# Patient Record
Sex: Female | Born: 1985 | Hispanic: No | Marital: Married | Smoking: Never smoker
Health system: Southern US, Community
[De-identification: ages and names within clinical notes are randomized; demographics above are authoritative.]

## PROBLEM LIST (undated history)

## (undated) ENCOUNTER — Inpatient Hospital Stay (HOSPITAL_COMMUNITY): Payer: Self-pay

## (undated) DIAGNOSIS — T4145XA Adverse effect of unspecified anesthetic, initial encounter: Secondary | ICD-10-CM

## (undated) DIAGNOSIS — T8859XA Other complications of anesthesia, initial encounter: Secondary | ICD-10-CM

## (undated) DIAGNOSIS — E039 Hypothyroidism, unspecified: Secondary | ICD-10-CM

## (undated) HISTORY — PX: DILATION AND CURETTAGE OF UTERUS: SHX78

## (undated) HISTORY — DX: Other complications of anesthesia, initial encounter: T88.59XA

## (undated) HISTORY — PX: NO PAST SURGERIES: SHX2092

## (undated) HISTORY — DX: Adverse effect of unspecified anesthetic, initial encounter: T41.45XA

---

## 2017-11-17 LAB — OB RESULTS CONSOLE GC/CHLAMYDIA
CHLAMYDIA, DNA PROBE: NEGATIVE
Chlamydia: NEGATIVE
GC PROBE AMP, GENITAL: NEGATIVE
GC PROBE AMP, GENITAL: NEGATIVE

## 2017-11-17 LAB — OB RESULTS CONSOLE ABO/RH: RH Type: POSITIVE

## 2017-11-17 LAB — OB RESULTS CONSOLE HIV ANTIBODY (ROUTINE TESTING)
HIV: NONREACTIVE
HIV: NONREACTIVE

## 2017-11-17 LAB — OB RESULTS CONSOLE ANTIBODY SCREEN: ANTIBODY SCREEN: NEGATIVE

## 2017-11-17 LAB — OB RESULTS CONSOLE HEPATITIS B SURFACE ANTIGEN
HEP B S AG: NEGATIVE
Hepatitis B Surface Ag: NEGATIVE

## 2017-11-17 LAB — OB RESULTS CONSOLE RUBELLA ANTIBODY, IGM
RUBELLA: IMMUNE
RUBELLA: IMMUNE

## 2017-11-17 LAB — OB RESULTS CONSOLE RPR
RPR: NONREACTIVE
RPR: NONREACTIVE

## 2018-04-10 ENCOUNTER — Inpatient Hospital Stay (HOSPITAL_BASED_OUTPATIENT_CLINIC_OR_DEPARTMENT_OTHER): Payer: Managed Care, Other (non HMO)

## 2018-04-10 ENCOUNTER — Encounter (HOSPITAL_COMMUNITY): Payer: Self-pay | Admitting: *Deleted

## 2018-04-10 ENCOUNTER — Inpatient Hospital Stay (HOSPITAL_COMMUNITY)
Admission: AD | Admit: 2018-04-10 | Discharge: 2018-04-10 | Disposition: A | Discharge status: Home / Self Care | Payer: Managed Care, Other (non HMO) | Source: Ambulatory Visit | Attending: Obstetrics and Gynecology | Admitting: Obstetrics and Gynecology

## 2018-04-10 DIAGNOSIS — Z3A32 32 weeks gestation of pregnancy: Secondary | ICD-10-CM | POA: Insufficient documentation

## 2018-04-10 DIAGNOSIS — O368130 Decreased fetal movements, third trimester, not applicable or unspecified: Secondary | ICD-10-CM

## 2018-04-10 DIAGNOSIS — O368190 Decreased fetal movements, unspecified trimester, not applicable or unspecified: Secondary | ICD-10-CM

## 2018-04-10 HISTORY — DX: Hypothyroidism, unspecified: E03.9

## 2018-04-10 IMAGING — US US MFM FETAL BPP W/O NON-STRESS
1 series · 12 of 28 positions shown · non-contrast
Comparison: none

[Series 1: us mfm fetal bpp w/o non-stress · 28 acquisitions, 12 frames shown]
[im 2/28]
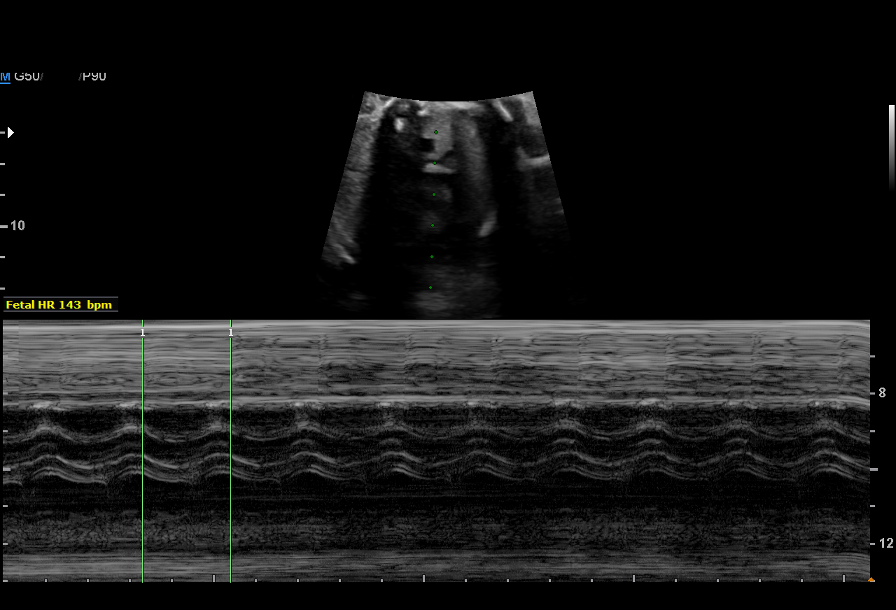
[im 4/28]
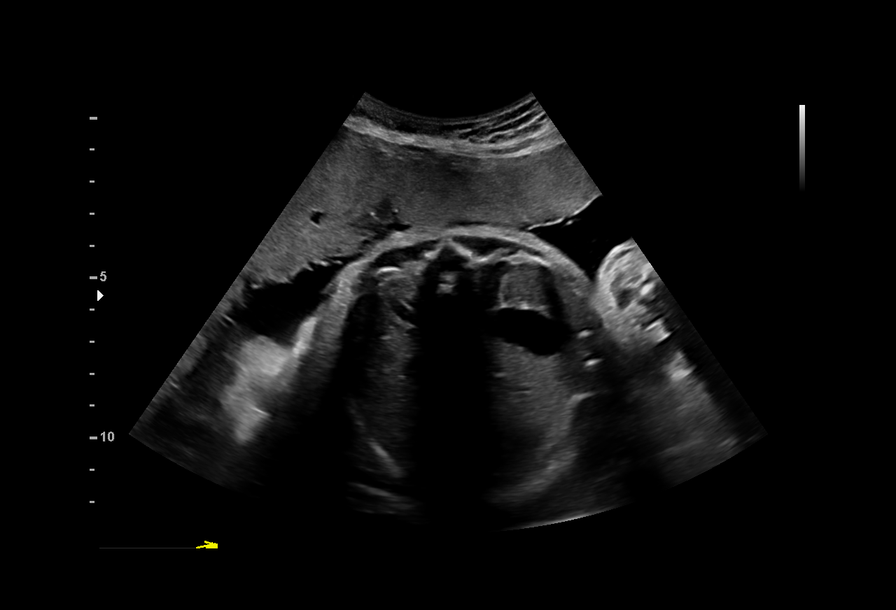
[im 6/28]
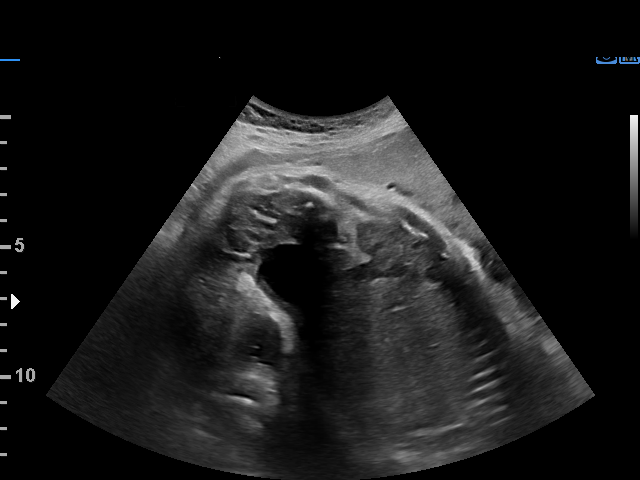
[im 9/28]
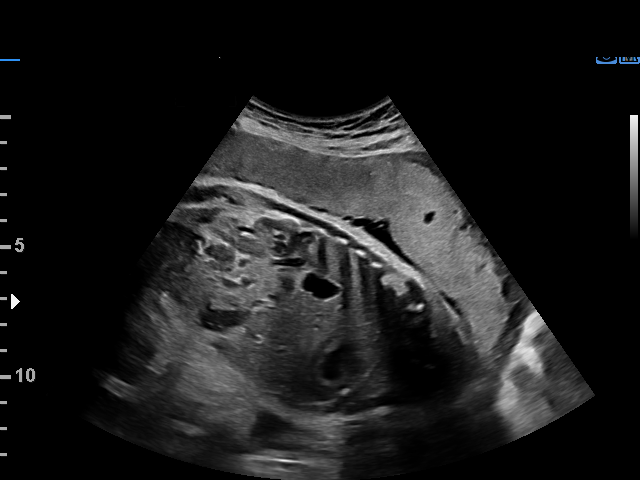
[im 11/28]
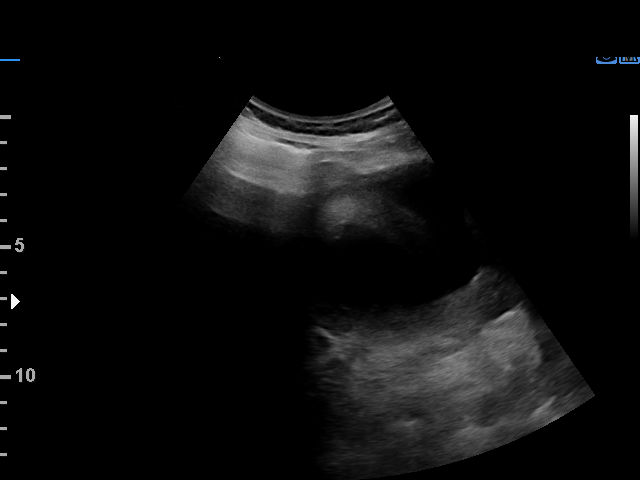
[im 13/28]
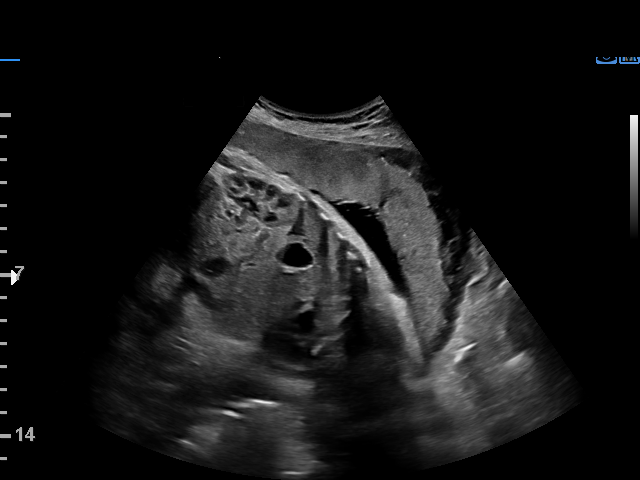
[im 16/28]
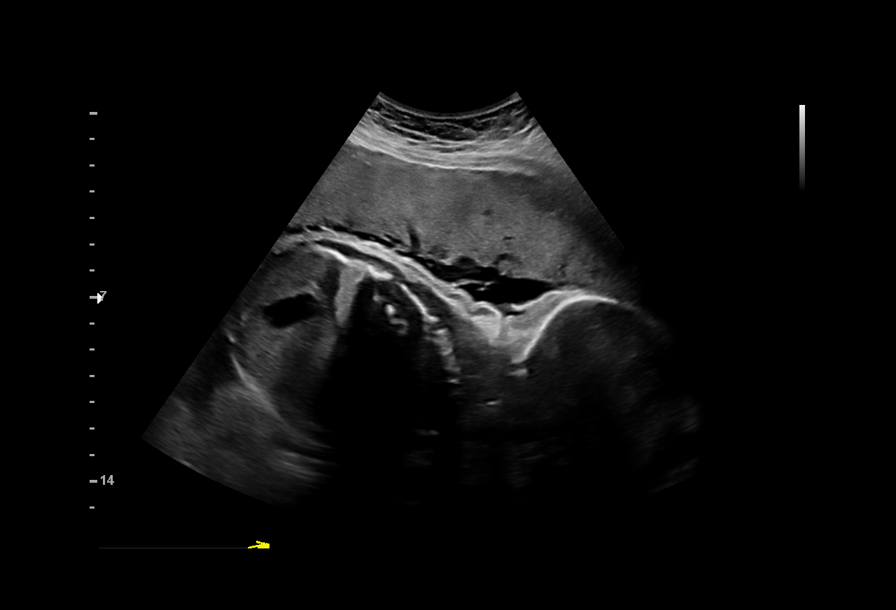
[im 18/28]
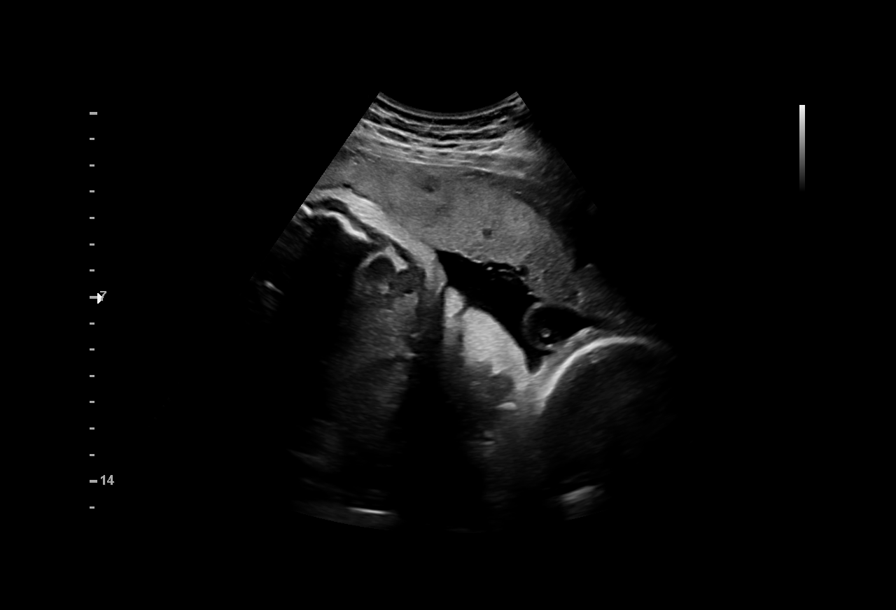
[im 20/28]
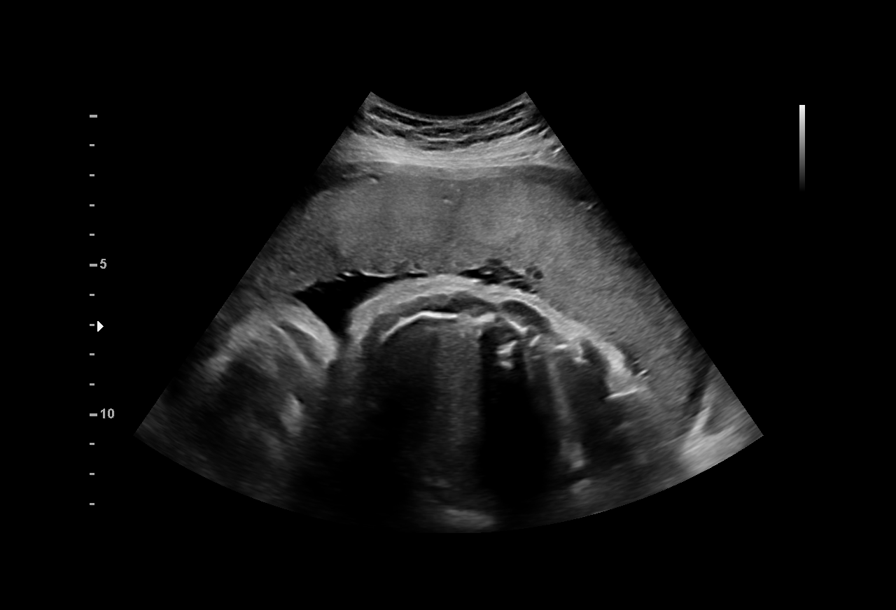
[im 23/28]
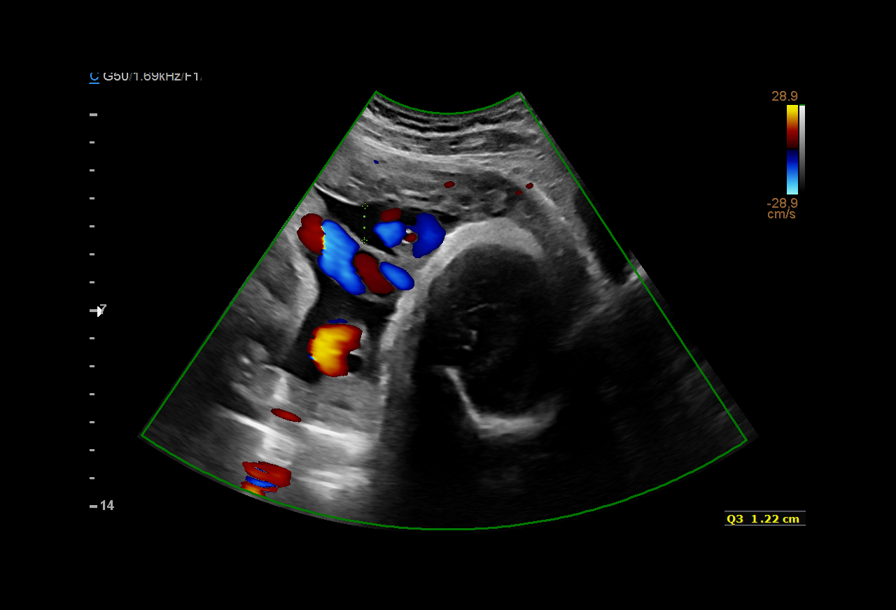
[im 25/28]
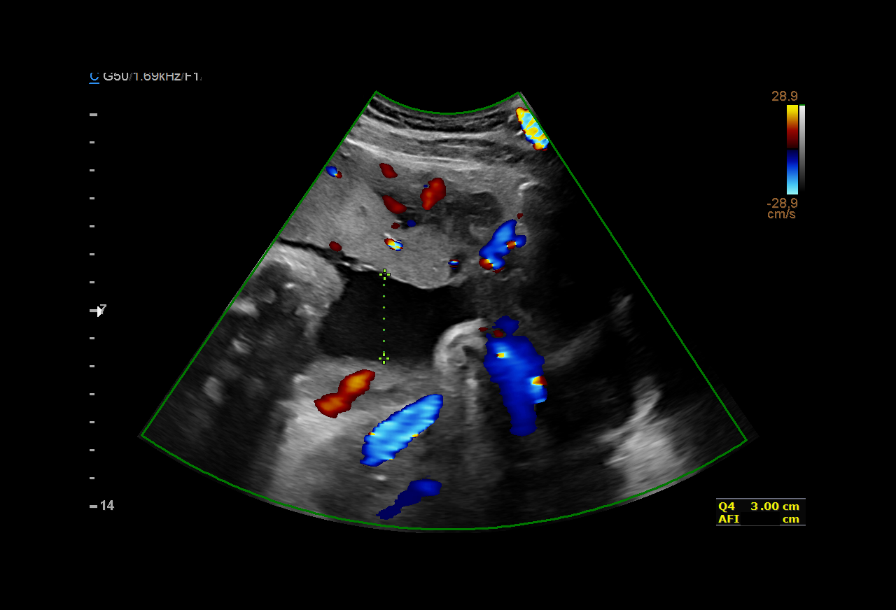
[im 27/28]
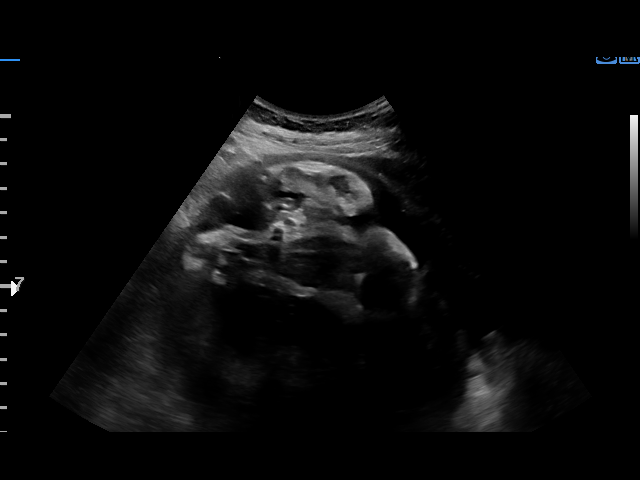

[12 of 28 positions shown; findings below may reference images not displayed]

[facility]

Indications

32 weeks gestation of pregnancy
Decreased fetal movement                       [PS]
Vital Signs

BMI:
Fetal Evaluation

Num Of Fetuses:          1
Fetal Heart Rate(bpm):   143
Cardiac Activity:        Observed
Presentation:            Cephalic
Placenta:                Anterior

Amniotic Fluid
AFI FV:      Within normal limits

AFI Sum(cm)     %Tile       Largest Pocket(cm)
12.42           35

RUQ(cm)       RLQ(cm)       LUQ(cm)        LLQ(cm)
4             3
Biophysical Evaluation

Amniotic F.V:   Within normal limits       F. Tone:         Observed
F. Movement:    Observed                   Score:           [DATE]
F. Breathing:   Observed
OB History

Gravidity:    1
Gestational Age

Clinical EDD:  32w 4d                                        EDD:   [DATE]
Best:          32w 4d     Det. By:  Clinical EDD             EDD:   [DATE]
Cervix Uterus Adnexa

Cervix
Not visualized (advanced GA >[PS])
Impression

IUP at [PS]
Vertex
BPP =[DATE]

## 2018-04-10 NOTE — MAU Note (Addendum)
Have felt baby move only 6 times all day long. Denies any LOF or bleeding. Denies any problems with pregnancy. No pain currently

## 2018-04-10 NOTE — MAU Provider Note (Signed)
History     Chief Complaint  Patient presents with  . Decreased Fetal Movement   32 yo G1P0 Asian female at 52  4/[redacted] weeks gestation presents with c/o decreased fetal movement.  (-) vaginal bleeding, or ctx  OB History    Gravida  1   Para      Term      Preterm      AB      Living        SAB      TAB      Ectopic      Multiple      Live Births              Past Medical History:  Diagnosis Date  . Hypothyroidism     Past Surgical History:  Procedure Laterality Date  . NO PAST SURGERIES      History reviewed. No pertinent family history.  Social History   Tobacco Use  . Smoking status: Never Smoker  . Smokeless tobacco: Never Used  Substance Use Topics  . Alcohol use: Not Currently    Frequency: Never  . Drug use: Never    Allergies: Allergies not on file  No medications prior to admission.     Physical Exam   Blood pressure 111/76, pulse 98, temperature 98.6 F (37 C), resp. rate 18, height 5\' 3"  (1.6 m), weight 81.6 kg.  General appearance: alert, cooperative and no distress Abdomen: gravid soft Extremities: no edema, redness or tenderness in the calves or thighs   Tracing: baseline 150 (+) accel x 1 to 160 No ctx ED Course  IMP: decreased fetal movement IUP@ 32 4/7 wks P) BPP for NR NST MDM  Addendum [place] Mfm Fetal Bpp Wo Non Stress  Result Date: 04/11/2018 ----------------------------------------------------------------------  OBSTETRICS REPORT                        (Signed Final 04/11/2018 08:34 am) ---------------------------------------------------------------------- Patient Info  ID #:       [id number]                          D.O.B.:  05/09/1986 (32 yrs)  Name:       Courtney Peters                   Visit Date: 04/10/2018 08:22 pm ---------------------------------------------------------------------- Performed By  Performed By:     Tomma Lightning             Ref. Address:      [facility]                    RDMS,RVT                                                               [facility]  [address]                                                              [place], [place]                                                              [address]  Attending:        Toma Copier MD            Location:          [facility]  Referred By:      Nena Jordan                    Gianny Sabino MD ---------------------------------------------------------------------- Orders   #  Description                           Code        Ordered By   1  [place] MFM FETAL BPP WO NON               76819.01    Reda Citron      STRESS                                            Egan Berkheimer  ----------------------------------------------------------------------   #  Order #                     Accession #                Episode #   1  [id number]                   [id number]                 [id number]  ---------------------------------------------------------------------- Indications   [redacted] weeks gestation of pregnancy                Z3A.32   Decreased fetal movement                       O36.8190  ---------------------------------------------------------------------- Vital Signs  Weight (lb): 180                               Height:        5'3"  BMI:         31.88 ---------------------------------------------------------------------- Fetal Evaluation  Num Of Fetuses:          1  Fetal Heart Rate(bpm):   143  Cardiac Activity:        Observed  Presentation:            Cephalic  Placenta:                Anterior  Amniotic Fluid  AFI FV:      Within normal limits  AFI Sum(cm)     %  Tile       Largest Pocket(cm)  12.42           35          4.2  RUQ(cm)       RLQ(cm)       LUQ(cm)        LLQ(cm)  4             3             4.2            1.22 ---------------------------------------------------------------------- Biophysical Evaluation  Amniotic F.V:   Within  normal limits       F. Tone:         Observed  F. Movement:    Observed                   Score:           8/8  F. Breathing:   Observed ---------------------------------------------------------------------- OB History  Gravidity:    1 ---------------------------------------------------------------------- Gestational Age  Clinical EDD:  32w 4d                                        EDD:   06/01/18  Best:          32w 4d     Det. By:  Clinical EDD             EDD:   06/01/18 ---------------------------------------------------------------------- Cervix Uterus Adnexa  Cervix  Not visualized (advanced GA >24wks) ---------------------------------------------------------------------- Impression  IUP at [redacted]w[redacted]d  Vertex  BPP =8/8 ----------------------------------------------------------------------                      Toma Copier, MD Electronically Signed Final Report   04/11/2018 08:34 am ----------------------------------------------------------------------  Serita Kyle, MD 7:57 PM 04/10/2018  D/c home. reassuring fetal testing. Resume daily kick ct. Keep sched OB appts

## 2018-04-23 ENCOUNTER — Ambulatory Visit (INDEPENDENT_AMBULATORY_CARE_PROVIDER_SITE_OTHER): Payer: Self-pay | Admitting: Pediatrics

## 2018-04-23 DIAGNOSIS — Z7681 Expectant parent(s) prebirth pediatrician visit: Secondary | ICD-10-CM

## 2018-04-25 ENCOUNTER — Encounter: Payer: Self-pay | Admitting: Pediatrics

## 2018-04-25 DIAGNOSIS — Z7681 Expectant parent(s) prebirth pediatrician visit: Secondary | ICD-10-CM | POA: Insufficient documentation

## 2018-04-25 NOTE — Progress Notes (Signed)
Prenatal counseling for impending newborn done-- Z76.81  

## 2018-04-30 LAB — OB RESULTS CONSOLE GBS: STREP GROUP B AG: NEGATIVE

## 2018-05-28 ENCOUNTER — Encounter (HOSPITAL_COMMUNITY): Payer: Self-pay | Admitting: *Deleted

## 2018-05-28 ENCOUNTER — Telehealth (HOSPITAL_COMMUNITY): Payer: Self-pay | Admitting: *Deleted

## 2018-05-28 NOTE — Telephone Encounter (Signed)
Preadmission screen  

## 2018-06-02 ENCOUNTER — Other Ambulatory Visit: Payer: Self-pay | Admitting: Obstetrics and Gynecology

## 2018-06-05 ENCOUNTER — Other Ambulatory Visit: Payer: Self-pay

## 2018-06-05 ENCOUNTER — Inpatient Hospital Stay (HOSPITAL_COMMUNITY)
Admission: RE | Admit: 2018-06-05 | Discharge: 2018-06-09 | DRG: 787 | Disposition: A | Discharge status: Home / Self Care | Payer: Managed Care, Other (non HMO) | Attending: Obstetrics and Gynecology | Admitting: Obstetrics and Gynecology

## 2018-06-05 ENCOUNTER — Encounter (HOSPITAL_COMMUNITY): Payer: Self-pay

## 2018-06-05 DIAGNOSIS — E039 Hypothyroidism, unspecified: Secondary | ICD-10-CM | POA: Diagnosis present

## 2018-06-05 DIAGNOSIS — O99284 Endocrine, nutritional and metabolic diseases complicating childbirth: Secondary | ICD-10-CM | POA: Diagnosis present

## 2018-06-05 DIAGNOSIS — Z3490 Encounter for supervision of normal pregnancy, unspecified, unspecified trimester: Secondary | ICD-10-CM | POA: Diagnosis present

## 2018-06-05 DIAGNOSIS — D62 Acute posthemorrhagic anemia: Secondary | ICD-10-CM | POA: Diagnosis not present

## 2018-06-05 DIAGNOSIS — O9081 Anemia of the puerperium: Secondary | ICD-10-CM | POA: Diagnosis not present

## 2018-06-05 DIAGNOSIS — Z3A40 40 weeks gestation of pregnancy: Secondary | ICD-10-CM

## 2018-06-05 DIAGNOSIS — O480 Post-term pregnancy: Principal | ICD-10-CM | POA: Diagnosis present

## 2018-06-05 DIAGNOSIS — O9902 Anemia complicating childbirth: Secondary | ICD-10-CM | POA: Diagnosis not present

## 2018-06-05 DIAGNOSIS — Z98891 History of uterine scar from previous surgery: Secondary | ICD-10-CM

## 2018-06-05 LAB — CBC
HCT: 36.5 % (ref 36.0–46.0)
Hemoglobin: 11.3 g/dL — ABNORMAL LOW (ref 12.0–15.0)
MCH: 26.9 pg (ref 26.0–34.0)
MCHC: 31 g/dL (ref 30.0–36.0)
MCV: 86.9 fL (ref 80.0–100.0)
Platelets: 197 10*3/uL (ref 150–400)
RBC: 4.2 MIL/uL (ref 3.87–5.11)
RDW: 15.4 % (ref 11.5–15.5)
WBC: 7.6 10*3/uL (ref 4.0–10.5)
nRBC: 0 % (ref 0.0–0.2)

## 2018-06-05 LAB — TYPE AND SCREEN
ABO/RH(D): A POS
Antibody Screen: NEGATIVE

## 2018-06-05 LAB — ABO/RH: ABO/RH(D): A POS

## 2018-06-05 MED ORDER — LIDOCAINE HCL (PF) 1 % IJ SOLN: 30.0000 mL | INTRAMUSCULAR | Status: DC | PRN

## 2018-06-05 MED ORDER — LACTATED RINGERS IV SOLN: 500.0000 mL | INTRAVENOUS | Status: DC | PRN

## 2018-06-05 MED ORDER — TERBUTALINE SULFATE 1 MG/ML IJ SOLN: 0.2500 mg | Freq: Once | INTRAMUSCULAR | Status: DC | PRN

## 2018-06-05 MED ORDER — OXYTOCIN 40 UNITS IN LACTATED RINGERS INFUSION - SIMPLE MED
1.0000 m[IU]/min | INTRAVENOUS | Status: DC
  Administered 2018-06-05: 2 m[IU]/min via INTRAVENOUS
  Administered 2018-06-06: 6 m[IU]/min via INTRAVENOUS
  Filled 2018-06-05: qty 1000

## 2018-06-05 MED ORDER — OXYTOCIN 40 UNITS IN LACTATED RINGERS INFUSION - SIMPLE MED: 2.5000 [IU]/h | INTRAVENOUS | Status: DC

## 2018-06-05 MED ORDER — ACETAMINOPHEN 325 MG PO TABS: 650.0000 mg | ORAL_TABLET | ORAL | Status: DC | PRN

## 2018-06-05 MED ORDER — LACTATED RINGERS IV SOLN
INTRAVENOUS | Status: DC
  Administered 2018-06-05 (×2): via INTRAVENOUS

## 2018-06-05 MED ORDER — OXYTOCIN BOLUS FROM INFUSION: 500.0000 mL | Freq: Once | INTRAVENOUS | Status: DC

## 2018-06-05 MED ORDER — MISOPROSTOL 50MCG HALF TABLET
50.0000 ug | ORAL_TABLET | ORAL | Status: DC
  Administered 2018-06-05: 50 ug via ORAL
  Filled 2018-06-05 (×2): qty 1

## 2018-06-05 MED ORDER — SOD CITRATE-CITRIC ACID 500-334 MG/5ML PO SOLN
30.0000 mL | ORAL | Status: DC | PRN
  Administered 2018-06-06: 30 mL via ORAL
  Filled 2018-06-05: qty 15

## 2018-06-05 MED ORDER — ONDANSETRON HCL 4 MG/2ML IJ SOLN: 4.0000 mg | Freq: Four times a day (QID) | INTRAMUSCULAR | Status: DC | PRN

## 2018-06-05 NOTE — H&P (Signed)
Courtney SlimmerSurbhi Tora Peters is a 32 y.o. female presenting for IOL 2nd to postdates. PNC complicated by hypothyroidism,. She was followed for .fetal growth which subsequently improved  OB History    Gravida  2   Para      Term      Preterm      AB  1   Living        SAB  1   TAB      Ectopic      Multiple      Live Births             Past Medical History:  Diagnosis Date  . Complication of anesthesia   . Hypothyroidism    Past Surgical History:  Procedure Laterality Date  . DILATION AND CURETTAGE OF UTERUS    . NO PAST SURGERIES     Family History: family history includes Diabetes in her father; Hypertension in her mother; Hypothyroidism in her mother. Social History:  reports that she has never smoked. She has never used smokeless tobacco. She reports previous alcohol use. She reports that she does not use drugs.     Maternal Diabetes: No Genetic Screening: Normal Maternal Ultrasounds/Referrals: Normal Fetal Ultrasounds or other Referrals:  None Maternal Substance Abuse:  No Significant Maternal Medications:  Meds include: Syntroid Significant Maternal Lab Results:  Lab values include: Group B Strep negative Other Comments:  hypothyroidism  Review of Systems  All other systems reviewed and are negative.  Maternal Medical History:  Fetal activity: Perceived fetal activity is normal.    Prenatal complications: hypothyroidism  Prenatal Complications - Diabetes: none.    Dilation: 1 Effacement (%): 50 Station: -3 Exam by:: Courtney SwazilandJordan Johnson, Courtney Peters  Blood pressure 113/88, pulse 96, temperature 98.3 F (36.8 C), temperature source Oral, resp. rate 16, height 5\' 3"  (1.6 m), weight 87.6 kg, last menstrual period 08/25/2017. Maternal Exam:  Uterine Assessment: Contraction strength is mild.  Contraction frequency is irregular and rare.   Abdomen: Patient reports no abdominal tenderness. Estimated fetal weight is 7lb 1/2 lb.   Fetal presentation:  vertex  Introitus: Normal vulva. Ferning test: not done.      Physical Exam  Constitutional: She is oriented to person, place, and time. She appears well-developed and well-nourished.  HENT:  Head: Atraumatic.  Eyes: EOM are normal.  Neck: Neck supple.  Cardiovascular: Regular rhythm.  Respiratory: Effort normal.  GI: Soft.  Genitourinary:    Vulva normal.   Musculoskeletal:        General: Edema present.  Neurological: She is alert and oriented to person, place, and time.  Skin: Skin is warm and dry.  Psychiatric: She has a normal mood and affect.   VE 1/70/-2 soft posterior  Prenatal labs: ABO, Rh: --/--/A POS (12/20 1642) Antibody: NEG (12/20 1642) Rubella: Immune, Immune (06/03 0000) RPR: Nonreactive, Nonreactive (06/03 0000)  HBsAg: Negative, Negative (06/03 0000)  HIV: Non-reactive, Non-reactive (06/03 0000)  GBS: Negative (11/14 0000)   Assessment/Plan: Post dates Hypothyroidism P) admit routine labs. Oral Cytotec. Intracervical balloon placed. Cont synthroid   Courtney Peters 06/05/2018, 5:46 PM

## 2018-06-06 ENCOUNTER — Inpatient Hospital Stay (HOSPITAL_COMMUNITY): Payer: Managed Care, Other (non HMO) | Admitting: Anesthesiology

## 2018-06-06 ENCOUNTER — Encounter (HOSPITAL_COMMUNITY): Payer: Self-pay

## 2018-06-06 ENCOUNTER — Encounter (HOSPITAL_COMMUNITY)
Admission: RE | Disposition: A | Discharge status: Home / Self Care | Payer: Self-pay | Source: Home / Self Care | Attending: Obstetrics and Gynecology

## 2018-06-06 LAB — RPR: RPR: NONREACTIVE

## 2018-06-06 LAB — OB RESULTS CONSOLE RUBELLA ANTIBODY, IGM: Rubella: IMMUNE

## 2018-06-06 SURGERY — Surgical Case
Anesthesia: Epidural

## 2018-06-06 MED ORDER — LACTATED RINGERS IV SOLN: INTRAVENOUS | Status: DC

## 2018-06-06 MED ORDER — KETOROLAC TROMETHAMINE 30 MG/ML IJ SOLN
INTRAMUSCULAR | Status: AC
  Filled 2018-06-06: qty 1

## 2018-06-06 MED ORDER — LIDOCAINE HCL (PF) 1 % IJ SOLN
INTRAMUSCULAR | Status: DC | PRN
  Administered 2018-06-06: 4 mL via EPIDURAL

## 2018-06-06 MED ORDER — SIMETHICONE 80 MG PO CHEW
80.0000 mg | CHEWABLE_TABLET | Freq: Three times a day (TID) | ORAL | Status: DC
  Administered 2018-06-06 – 2018-06-09 (×7): 80 mg via ORAL
  Filled 2018-06-06 (×7): qty 1

## 2018-06-06 MED ORDER — WITCH HAZEL-GLYCERIN EX PADS: 1.0000 "application " | MEDICATED_PAD | CUTANEOUS | Status: DC | PRN

## 2018-06-06 MED ORDER — ZOLPIDEM TARTRATE 5 MG PO TABS: 5.0000 mg | ORAL_TABLET | Freq: Every evening | ORAL | Status: DC | PRN

## 2018-06-06 MED ORDER — DEXAMETHASONE SODIUM PHOSPHATE 10 MG/ML IJ SOLN
INTRAMUSCULAR | Status: DC | PRN
  Administered 2018-06-06: 10 mg via INTRAVENOUS

## 2018-06-06 MED ORDER — EPHEDRINE 5 MG/ML INJ
INTRAVENOUS | Status: AC
  Filled 2018-06-06: qty 10

## 2018-06-06 MED ORDER — MORPHINE SULFATE (PF) 0.5 MG/ML IJ SOLN
INTRAMUSCULAR | Status: DC | PRN
  Administered 2018-06-06: 2 mg via EPIDURAL

## 2018-06-06 MED ORDER — DEXAMETHASONE SODIUM PHOSPHATE 10 MG/ML IJ SOLN
INTRAMUSCULAR | Status: AC
  Filled 2018-06-06: qty 1

## 2018-06-06 MED ORDER — SENNOSIDES-DOCUSATE SODIUM 8.6-50 MG PO TABS
2.0000 | ORAL_TABLET | ORAL | Status: DC
  Administered 2018-06-06 – 2018-06-08 (×3): 2 via ORAL
  Filled 2018-06-06 (×3): qty 2

## 2018-06-06 MED ORDER — DIBUCAINE 1 % RE OINT: 1.0000 "application " | TOPICAL_OINTMENT | RECTAL | Status: DC | PRN

## 2018-06-06 MED ORDER — MORPHINE SULFATE (PF) 0.5 MG/ML IJ SOLN
INTRAMUSCULAR | Status: AC
  Filled 2018-06-06: qty 10

## 2018-06-06 MED ORDER — LACTATED RINGERS IV SOLN: 500.0000 mL | Freq: Once | INTRAVENOUS | Status: DC

## 2018-06-06 MED ORDER — PHENYLEPHRINE 40 MCG/ML (10ML) SYRINGE FOR IV PUSH (FOR BLOOD PRESSURE SUPPORT): 80.0000 ug | PREFILLED_SYRINGE | INTRAVENOUS | Status: DC | PRN

## 2018-06-06 MED ORDER — EPHEDRINE 5 MG/ML INJ: 10.0000 mg | INTRAVENOUS | Status: DC | PRN

## 2018-06-06 MED ORDER — LACTATED RINGERS IV SOLN
INTRAVENOUS | Status: DC | PRN
  Administered 2018-06-06 (×3): via INTRAVENOUS

## 2018-06-06 MED ORDER — MENTHOL 3 MG MT LOZG: 1.0000 | LOZENGE | OROMUCOSAL | Status: DC | PRN

## 2018-06-06 MED ORDER — METHYLERGONOVINE MALEATE 0.2 MG/ML IJ SOLN
INTRAMUSCULAR | Status: AC
  Filled 2018-06-06: qty 1

## 2018-06-06 MED ORDER — SIMETHICONE 80 MG PO CHEW: 80.0000 mg | CHEWABLE_TABLET | ORAL | Status: DC | PRN

## 2018-06-06 MED ORDER — PHENYLEPHRINE 40 MCG/ML (10ML) SYRINGE FOR IV PUSH (FOR BLOOD PRESSURE SUPPORT)
80.0000 ug | PREFILLED_SYRINGE | INTRAVENOUS | Status: AC | PRN
  Administered 2018-06-06 (×2): 40 ug via INTRAVENOUS
  Administered 2018-06-06: 80 ug via INTRAVENOUS

## 2018-06-06 MED ORDER — LACTATED RINGERS AMNIOINFUSION: INTRAVENOUS | Status: DC

## 2018-06-06 MED ORDER — CEFAZOLIN SODIUM-DEXTROSE 2-3 GM-%(50ML) IV SOLR
INTRAVENOUS | Status: DC | PRN
  Administered 2018-06-06: 2 g via INTRAVENOUS

## 2018-06-06 MED ORDER — KETOROLAC TROMETHAMINE 30 MG/ML IJ SOLN
30.0000 mg | Freq: Once | INTRAMUSCULAR | Status: AC | PRN
  Administered 2018-06-06: 30 mg via INTRAVENOUS

## 2018-06-06 MED ORDER — ONDANSETRON HCL 4 MG/2ML IJ SOLN
INTRAMUSCULAR | Status: AC
  Filled 2018-06-06: qty 2

## 2018-06-06 MED ORDER — PHENYLEPHRINE 40 MCG/ML (10ML) SYRINGE FOR IV PUSH (FOR BLOOD PRESSURE SUPPORT)
80.0000 ug | PREFILLED_SYRINGE | INTRAVENOUS | Status: DC | PRN
  Filled 2018-06-06: qty 10

## 2018-06-06 MED ORDER — MIDAZOLAM HCL 2 MG/2ML IJ SOLN: 0.5000 mg | Freq: Once | INTRAMUSCULAR | Status: DC | PRN

## 2018-06-06 MED ORDER — OXYTOCIN 40 UNITS IN LACTATED RINGERS INFUSION - SIMPLE MED: 2.5000 [IU]/h | INTRAVENOUS | Status: DC

## 2018-06-06 MED ORDER — MORPHINE SULFATE (PF) 4 MG/ML IV SOLN: 1.0000 mg | INTRAVENOUS | Status: DC | PRN

## 2018-06-06 MED ORDER — DIPHENHYDRAMINE HCL 50 MG/ML IJ SOLN: 12.5000 mg | INTRAMUSCULAR | Status: DC | PRN

## 2018-06-06 MED ORDER — FENTANYL 2.5 MCG/ML BUPIVACAINE 1/10 % EPIDURAL INFUSION (WH - ANES)
14.0000 mL/h | INTRAMUSCULAR | Status: DC | PRN
  Administered 2018-06-06: 14 mL/h via EPIDURAL
  Filled 2018-06-06: qty 100

## 2018-06-06 MED ORDER — MEPERIDINE HCL 25 MG/ML IJ SOLN: 6.2500 mg | INTRAMUSCULAR | Status: DC | PRN

## 2018-06-06 MED ORDER — METHYLERGONOVINE MALEATE 0.2 MG/ML IJ SOLN
INTRAMUSCULAR | Status: DC | PRN
  Administered 2018-06-06: 0.2 mg via INTRAMUSCULAR

## 2018-06-06 MED ORDER — PROMETHAZINE HCL 25 MG/ML IJ SOLN: 6.2500 mg | INTRAMUSCULAR | Status: DC | PRN

## 2018-06-06 MED ORDER — LEVOTHYROXINE SODIUM 50 MCG PO TABS
50.0000 ug | ORAL_TABLET | Freq: Every day | ORAL | Status: DC
  Administered 2018-06-06 – 2018-06-09 (×4): 50 ug via ORAL
  Filled 2018-06-06 (×5): qty 1

## 2018-06-06 MED ORDER — SIMETHICONE 80 MG PO CHEW
80.0000 mg | CHEWABLE_TABLET | ORAL | Status: DC
  Administered 2018-06-06 – 2018-06-08 (×3): 80 mg via ORAL
  Filled 2018-06-06 (×3): qty 1

## 2018-06-06 MED ORDER — IBUPROFEN 800 MG PO TABS
800.0000 mg | ORAL_TABLET | Freq: Three times a day (TID) | ORAL | Status: AC
  Administered 2018-06-06 – 2018-06-09 (×8): 800 mg via ORAL
  Filled 2018-06-06 (×8): qty 1

## 2018-06-06 MED ORDER — PHENYLEPHRINE 40 MCG/ML (10ML) SYRINGE FOR IV PUSH (FOR BLOOD PRESSURE SUPPORT)
PREFILLED_SYRINGE | INTRAVENOUS | Status: AC
  Filled 2018-06-06: qty 110

## 2018-06-06 MED ORDER — BUPIVACAINE HCL (PF) 0.25 % IJ SOLN
INTRAMUSCULAR | Status: DC | PRN
  Administered 2018-06-06: 20 mL
  Administered 2018-06-06: 10 mL

## 2018-06-06 MED ORDER — LIDOCAINE-EPINEPHRINE 2 %-1:100000 IJ SOLN
INTRAMUSCULAR | Status: DC | PRN
  Administered 2018-06-06: 8 mL via INTRADERMAL

## 2018-06-06 MED ORDER — SCOPOLAMINE 1 MG/3DAYS TD PT72
MEDICATED_PATCH | TRANSDERMAL | Status: AC
  Filled 2018-06-06: qty 1

## 2018-06-06 MED ORDER — PRENATAL MULTIVITAMIN CH
1.0000 | ORAL_TABLET | Freq: Every day | ORAL | Status: DC
  Administered 2018-06-07 – 2018-06-09 (×3): 1 via ORAL
  Filled 2018-06-06 (×3): qty 1

## 2018-06-06 MED ORDER — DIPHENHYDRAMINE HCL 25 MG PO CAPS: 25.0000 mg | ORAL_CAPSULE | Freq: Four times a day (QID) | ORAL | Status: DC | PRN

## 2018-06-06 MED ORDER — SCOPOLAMINE 1 MG/3DAYS TD PT72
MEDICATED_PATCH | TRANSDERMAL | Status: DC | PRN
  Administered 2018-06-06: 1 via TRANSDERMAL

## 2018-06-06 MED ORDER — METOCLOPRAMIDE HCL 5 MG/ML IJ SOLN
INTRAMUSCULAR | Status: DC | PRN
  Administered 2018-06-06: 5 mg via INTRAVENOUS

## 2018-06-06 MED ORDER — OXYTOCIN 10 UNIT/ML IJ SOLN
INTRAMUSCULAR | Status: AC
  Filled 2018-06-06: qty 3

## 2018-06-06 MED ORDER — OXYTOCIN 10 UNIT/ML IJ SOLN
INTRAVENOUS | Status: DC | PRN
  Administered 2018-06-06 (×2): 40 [IU] via INTRAVENOUS

## 2018-06-06 MED ORDER — PHENYLEPHRINE 40 MCG/ML (10ML) SYRINGE FOR IV PUSH (FOR BLOOD PRESSURE SUPPORT)
PREFILLED_SYRINGE | INTRAVENOUS | Status: AC
  Filled 2018-06-06: qty 10

## 2018-06-06 MED ORDER — OXYCODONE HCL 5 MG PO TABS
5.0000 mg | ORAL_TABLET | ORAL | Status: DC | PRN
  Administered 2018-06-09: 5 mg via ORAL
  Filled 2018-06-06 (×2): qty 1

## 2018-06-06 MED ORDER — COCONUT OIL OIL
1.0000 "application " | TOPICAL_OIL | Status: DC | PRN
  Administered 2018-06-08: 1 via TOPICAL
  Filled 2018-06-06: qty 120

## 2018-06-06 MED ORDER — ONDANSETRON HCL 4 MG/2ML IJ SOLN
INTRAMUSCULAR | Status: DC | PRN
  Administered 2018-06-06: 4 mg via INTRAVENOUS

## 2018-06-06 MED ORDER — BUPIVACAINE HCL (PF) 0.25 % IJ SOLN
INTRAMUSCULAR | Status: AC
  Filled 2018-06-06: qty 30

## 2018-06-06 SURGICAL SUPPLY — 43 items
BARRIER ADHS 3X4 INTERCEED (GAUZE/BANDAGES/DRESSINGS) ×3 IMPLANT
BENZOIN TINCTURE PRP APPL 2/3 (GAUZE/BANDAGES/DRESSINGS) ×3 IMPLANT
CHLORAPREP W/TINT 26ML (MISCELLANEOUS) ×3 IMPLANT
CLAMP CORD UMBIL (MISCELLANEOUS) IMPLANT
CLOSURE WOUND 1/2 X4 (GAUZE/BANDAGES/DRESSINGS) ×1
CLOTH BEACON ORANGE TIMEOUT ST (SAFETY) ×3 IMPLANT
DRAPE C SECTION CLR SCREEN (DRAPES) ×3 IMPLANT
DRSG OPSITE POSTOP 4X10 (GAUZE/BANDAGES/DRESSINGS) ×3 IMPLANT
ELECT REM PT RETURN 9FT ADLT (ELECTROSURGICAL) ×3
ELECTRODE REM PT RTRN 9FT ADLT (ELECTROSURGICAL) ×1 IMPLANT
EXTRACTOR VACUUM M CUP 4 TUBE (SUCTIONS) IMPLANT
EXTRACTOR VACUUM M CUP 4' TUBE (SUCTIONS)
GLOVE BIOGEL PI IND STRL 7.0 (GLOVE) ×2 IMPLANT
GLOVE BIOGEL PI INDICATOR 7.0 (GLOVE) ×4
GLOVE ECLIPSE 6.5 STRL STRAW (GLOVE) ×3 IMPLANT
GOWN STRL REUS W/TWL LRG LVL3 (GOWN DISPOSABLE) ×6 IMPLANT
KIT ABG SYR 3ML LUER SLIP (SYRINGE) IMPLANT
NEEDLE HYPO 22GX1.5 SAFETY (NEEDLE) ×3 IMPLANT
NEEDLE HYPO 25X5/8 SAFETYGLIDE (NEEDLE) IMPLANT
NS IRRIG 1000ML POUR BTL (IV SOLUTION) ×3 IMPLANT
PACK C SECTION WH (CUSTOM PROCEDURE TRAY) ×3 IMPLANT
PAD OB MATERNITY 4.3X12.25 (PERSONAL CARE ITEMS) ×3 IMPLANT
RTRCTR C-SECT PINK 25CM LRG (MISCELLANEOUS) IMPLANT
SPONGE LAP 18X18 RF (DISPOSABLE) ×9 IMPLANT
STRIP CLOSURE SKIN 1/2X4 (GAUZE/BANDAGES/DRESSINGS) ×2 IMPLANT
SUT CHROMIC GUT AB #0 18 (SUTURE) IMPLANT
SUT MNCRL 0 VIOLET CTX 36 (SUTURE) ×3 IMPLANT
SUT MON AB 2-0 SH 27 (SUTURE)
SUT MON AB 2-0 SH27 (SUTURE) IMPLANT
SUT MON AB 3-0 SH 27 (SUTURE)
SUT MON AB 3-0 SH27 (SUTURE) IMPLANT
SUT MON AB 4-0 PS1 27 (SUTURE) IMPLANT
SUT MONOCRYL 0 CTX 36 (SUTURE) ×6
SUT PLAIN 2 0 (SUTURE)
SUT PLAIN 2 0 XLH (SUTURE) IMPLANT
SUT PLAIN ABS 2-0 CT1 27XMFL (SUTURE) IMPLANT
SUT VIC AB 0 CT1 36 (SUTURE) ×6 IMPLANT
SUT VIC AB 2-0 CT1 27 (SUTURE) ×2
SUT VIC AB 2-0 CT1 TAPERPNT 27 (SUTURE) ×1 IMPLANT
SUT VIC AB 4-0 PS2 27 (SUTURE) IMPLANT
SYR CONTROL 10ML LL (SYRINGE) ×3 IMPLANT
TOWEL OR 17X24 6PK STRL BLUE (TOWEL DISPOSABLE) ×3 IMPLANT
TRAY FOLEY W/BAG SLVR 14FR LF (SET/KITS/TRAYS/PACK) IMPLANT

## 2018-06-06 NOTE — Brief Op Note (Signed)
06/06/2018  8:54 AM  PATIENT:  Courtney Peters  32 y.o. female  PRE-OPERATIVE DIAGNOSIS:  Fetal Intolerance to Labor, Post Dates  POST-OPERATIVE DIAGNOSIS:  Fetal Intolerance to Labor, Post Dates  PROCEDURE:  Primary Cesarean section, kerr hysterotomy  SURGEON:  Surgeon(s) and Role:    * Maxie Betterousins, Embrie Mikkelsen, MD - Primary  PHYSICIAN ASSISTANT:   ASSISTANTS: Marlinda Mikeanya Bailey, CNM   ANESTHESIA:   epidural Findings: live female OP. Nl tubes and ovaries EBL:  145 mL   BLOOD ADMINISTERED:none  DRAINS: none   LOCAL MEDICATIONS USED:  MARCAINE     SPECIMEN:  Source of Specimen:  placenta not sent  DISPOSITION OF SPECIMEN:  N/A  COUNTS:  YES  TOURNIQUET:  * No tourniquets in log *  DICTATION: .Other Dictation: Dictation Number [id number]  PLAN OF CARE: Admit to inpatient   PATIENT DISPOSITION:  PACU - hemodynamically stable.   Delay start of Pharmacological VTE agent (>24hrs) due to surgical blood loss or risk of bleeding: no

## 2018-06-06 NOTE — Anesthesia Preprocedure Evaluation (Signed)
Anesthesia Evaluation  Patient identified by MRN, date of birth, ID band Patient awake    Reviewed: Allergy & Precautions, Patient's Chart, lab work & pertinent test results  Airway Mallampati: II       Dental no notable dental hx.    Pulmonary    Pulmonary exam normal        Cardiovascular Normal cardiovascular exam     Neuro/Psych    GI/Hepatic   Endo/Other  Hypothyroidism   Renal/GU      Musculoskeletal   Abdominal   Peds  Hematology   Anesthesia Other Findings   Reproductive/Obstetrics (+) Pregnancy                             Anesthesia Physical Anesthesia Plan  ASA: II  Anesthesia Plan: Epidural   Post-op Pain Management:    Induction:   PONV Risk Score and Plan:   Airway Management Planned: Natural Airway  Additional Equipment:   Intra-op Plan:   Post-operative Plan:   Informed Consent: I have reviewed the patients History and Physical, chart, labs and discussed the procedure including the risks, benefits and alternatives for the proposed anesthesia with the patient or authorized representative who has indicated his/her understanding and acceptance.     Plan Discussed with:   Anesthesia Plan Comments: (Lab Results      Component                Value               Date                      WBC                      7.6                 06/05/2018                HGB                      11.3 (L)            06/05/2018                HCT                      36.5                06/05/2018                MCV                      86.9                06/05/2018                PLT                      197                 06/05/2018           )        Anesthesia Quick Evaluation

## 2018-06-06 NOTE — Transfer of Care (Signed)
Immediate Anesthesia Transfer of Care Note  Patient: Courtney Peters  Procedure(s) Performed: CESAREAN SECTION (N/A )  Patient Location: PACU  Anesthesia Type:Epidural  Level of Consciousness: awake, alert  and oriented  Airway & Oxygen Therapy: Patient Spontanous Breathing  Post-op Assessment: Report given to RN and Post -op Vital signs reviewed and stable  Post vital signs: Reviewed and stable  Last Vitals:  Vitals Value Taken Time  BP 128/68 06/06/2018  8:58 AM  Temp 36.4 C 06/06/2018  8:58 AM  Pulse 104 06/06/2018  9:00 AM  Resp 24 06/06/2018  9:00 AM  SpO2 98 % 06/06/2018  9:00 AM  Vitals shown include unvalidated device data.  Last Pain:  Vitals:   06/06/18 0858  TempSrc: Oral         Complications: No apparent anesthesia complications

## 2018-06-06 NOTE — Progress Notes (Signed)
CSW received and acknowledges consult for EDPS of 9.  Consult screened out due to 9 on EDPS does not warrant a CSW consult.  MOB whom scores are greater than 9/yes to question 10 on Edinburgh Postpartum Depression Screen warrants a CSW consult.   Jujhar Everett Boyd-Gilyard, MSW, LCSW Clinical Social Work [phone]  

## 2018-06-06 NOTE — Anesthesia Procedure Notes (Signed)
Epidural Patient location during procedure: OB Start time: 06/06/2018 1:48 AM End time: 06/06/2018 1:54 AM  Staffing Anesthesiologist: Shelton SilvasHollis, Zebastian Carico D, MD Performed: anesthesiologist   Preanesthetic Checklist Completed: patient identified, site marked, surgical consent, pre-op evaluation, timeout performed, IV checked, risks and benefits discussed and monitors and equipment checked  Epidural Patient position: sitting Prep: ChloraPrep Patient monitoring: heart rate, continuous pulse ox and blood pressure Approach: midline Location: L3-L4 Injection technique: LOR saline  Needle:  Needle type: Tuohy  Needle gauge: 17 G Needle length: 9 cm Catheter type: closed end flexible Catheter size: 20 Guage Test dose: negative and 1.5% lidocaine  Assessment Events: blood not aspirated, injection not painful, no injection resistance and no paresthesia  Additional Notes LOR @ 5  Patient identified. Risks/Benefits/Options discussed with patient including but not limited to bleeding, infection, nerve damage, paralysis, failed block, incomplete pain control, headache, blood pressure changes, nausea, vomiting, reactions to medications, itching and postpartum back pain. Confirmed with bedside nurse the patient's most recent platelet count. Confirmed with patient that they are not currently taking any anticoagulation, have any bleeding history or any family history of bleeding disorders. Patient expressed understanding and wished to proceed. All questions were answered. Sterile technique was used throughout the entire procedure. Please see nursing notes for vital signs. Test dose was given through epidural catheter and negative prior to continuing to dose epidural or start infusion. Warning signs of high block given to the patient including shortness of breath, tingling/numbness in hands, complete motor block, or any concerning symptoms with instructions to call for help. Patient was given instructions  on fall risk and not to get out of bed. All questions and concerns addressed with instructions to call with any issues or inadequate analgesia.    Reason for block:procedure for pain

## 2018-06-06 NOTE — Progress Notes (Signed)
Resumption of late decelerations Pitocin discontinued. lates resolved as we prepare for C/S Will therefore proceed with C/S Risk of C/s reviewed with pt and husband  consent signed  fetal intolerance to labor.  cord blood( private) planned OR aware

## 2018-06-06 NOTE — Progress Notes (Signed)
Called by RN @ 5:17 am regarding repetitive late deceleration. On arrival, pitocin 6 miu. Intracervical balloon out Tracing reviewed.  SROM at 2 am  epidural BP 102/80   Pulse 94   Temp 98.3 F (36.8 C) (Oral)   Resp 18   Ht 5\' 3"  (1.6 m)   Wt 87.6 kg   LMP 08/25/2017   BMI 34.21 kg/m  Tracing after epidural had some late deceleration Baseline 150   \VE 5-6/patulous/-2 (+) variable with exam and concomitant ctxs ISE/IUPC placed for poss  Cord compression Pitocin discontinued  IMP: active phase Post dates P) amnioinfusion. Watch FHR closely

## 2018-06-06 NOTE — Progress Notes (Signed)
Amnioinfusion in Tracing: baseline 150 min variability small accels with ctxs Late decel resolved  Ctx q 2-5 mins Restart pitocin Maternal O2 Pt and husband advised if lates recurs then need  C/S

## 2018-06-06 NOTE — Lactation Note (Signed)
This note was copied from a baby's chart. Lactation Consultation Note  Patient Name: Courtney Peters BarSurbhi Florido ZOXWR'UToday's Date: 06/06/2018 Reason for consult: Initial assessment;Primapara;1st time breastfeeding;Term;Maternal endocrine disorder Type of Endocrine Disorder?: Thyroid(hypothyroidism)  4 hours old FT female who is being exclusively BF by her mother, she's a P1. [facility] and she's already familiar with hand expression. When reviewing hand expression with her, noticed that her nipples are short shafted. LC set her up with breast shells, instructions, cleaning and storage were reviewed. Mom able to get colostrum out of both breasts. She has a Lansinoh DEBP at home, that she plans to bring to the hospital to start pumping.  Offered assistance with latch and mom agreed to wake baby up to feed. LC took baby STS to mom's left breast and she was able to latch after a few tries for 6 minutes, she only came off once and was easily repositioned. A few audible swallows were heard, parents were very pleased. Discussed cluster feeding, newborn sleeping cycle and lactogenesis II. Mom had a c-section and she also has Hx of hypothyroidism, LC explained to parents the benefits of pumping early on at the hospital.  Feeding plan:  1. Encouraged mom to feed baby STS 8-12 times/24 hours or sooner if feeding cues are present 2. Hand expression and spoon feeding was also encouraged 3. Mom will bring her Lansinoh DEBP from home to start pumping every 3 hours after feedings, at least 6 pumping sessions/24 hours  BF brochure, BF resources and feeding diary were reviewed. Parents reported all questions and concerns were answered, they're both aware of LC services and will call PRN.  Maternal Data Formula Feeding for Exclusion: No Has patient been taught Hand Expression?: Yes Does the patient have breastfeeding experience prior to this delivery?: No  Feeding Feeding Type: Breast Fed  LATCH  Score Latch: Repeated attempts needed to sustain latch, nipple held in mouth throughout feeding, stimulation needed to elicit sucking reflex.  Audible Swallowing: A few with stimulation  Type of Nipple: Everted at rest and after stimulation(short shafted)  Comfort (Breast/Nipple): Soft / non-tender  Hold (Positioning): Assistance needed to correctly position infant at breast and maintain latch.  LATCH Score: 7  Interventions Interventions: Breast feeding basics reviewed;Assisted with latch;Skin to skin;Breast massage;Hand express;Reverse pressure;Breast compression;Shells;Support pillows;Adjust position  Lactation Tools Discussed/Used Tools: Shells Shell Type: Inverted WIC Program: No   Consult Status Consult Status: Follow-up Date: 06/07/18 Follow-up type: In-patient    Kimya Mccahill Venetia ConstableS Alika Eppes 06/06/2018, 12:11 PM

## 2018-06-06 NOTE — Op Note (Signed)
Courtney Peters, Courtney Peters MEDICAL RECORD [id number] ACCOUNT [id number] DATE OF BIRTH:06/19/1985 FACILITY: [facility] LOCATIO: [id number]: [id number] PHYSICIAN:Cyntia Staley A. Issac Moure, MD  OPERATIVE REPORT  DATE OF PROCEDURE:  06/06/2018  PREOPERATIVE DIAGNOSIS:  Fetal intolerance to labor post date.  PROCEDURE:  Primary cesarean section, Kerr hysterotomy.  POSTOPERATIVE DIAGNOSIS:  Fetal intolerance to labor post date.  ANESTHESIA:  Epidural.  SURGEON:  Maxie BetterSheronette Riham Polyakov, MD  ASSISTANT:  Marlinda Mikeanya Bailey, CNM   DESCRIPTION OF PROCEDURE:  Under adequate epidural anesthesia, the patient was placed in the supine position with a left lateral tilt.  She was sterilely prepped and draped in the usual fashion.  An indwelling Foley catheter was already in place.   Marcaine 0.25% was injected along the planned Pfannenstiel skin incision.  Pfannenstiel skin incision was then made, carried down to the rectus fascia.  The rectus fascia was opened transversely.  The rectus fascia was then bluntly and sharply dissected  off the rectus muscle in a superior and inferior fashion.  The rectus  muscle was split in the midline.  The parietal peritoneum was entered bluntly and extended.  A self-retaining Alexis retractor was then placed.  The vesicouterine peritoneum was  opened transversely.  The bladder was displaced inferiorly with a bladder retractor after blunt dissection was done.  A curvilinear low transverse uterine incision was made and was extended bluntly with cephalic and caudad direction.  Subsequent delivery  of a live female from an occiput posterior position was accomplished.  The baby was bulb suctioned on the abdomen.  The cord was delayed clamped for a minute.  The cord was then clamped, cut.  The baby was transferred to the awaiting pediatricians.   Apgars are pending at this time.  Cord blood banking sample was obtained, as was a small piece of cord for the patient collection.  The placenta was spontaneous, intact,  and not sent to pathology.  Uterine cavity was cleaned of debris.  Uterine incision  had no extensions.  The incision was closed in 2 layers.  The first layer 0 Monocryl running lock stitch, second layer was imbricated using 0 Monocryl suture.  Good hemostasis was achieved.  Normal tubes and ovaries were noted bilaterally.  The abdomen  was irrigated and suctioned of debris.  Interceed was placed in the lower uterine segment in inverted T fashion.  The Alexis retractor was then removed.  The parietal peritoneum was closed with 2-0 Vicryl.  The rectus fascia was closed with 0 Vicryl x2.   The subcutaneous area was irrigated, small bleeders cauterized.  Interrupted 2-0 plain sutures placed, and the skin approximated with 4-0 Vicryl subcuticular closure.  Steri-Strips and benzoin were placed.  SPECIMEN:  Placenta not sent to pathology.  ESTIMATED BLOOD LOSS:  400 mL.  INTRAOPERATIVE FLUIDS:  2300 mL.  URINE OUTPUT:  250 mL clear yellow urine.  COUNTS:  Sponge and instrument counts x2 was correct.  COMPLICATIONS:  None.  The patient tolerated the procedure well and was transferred to recovery room in stable condition.  LN/NUANCE  D:06/06/2018 T:06/06/2018 JOB:[id number]

## 2018-06-06 NOTE — Anesthesia Postprocedure Evaluation (Signed)
Anesthesia Post Note  Patient: Courtney Peters  Procedure(s) Performed: CESAREAN SECTION (N/A )     Patient location during evaluation: PACU Anesthesia Type: Epidural Level of consciousness: awake and alert, patient cooperative and oriented Pain management: pain level controlled Vital Signs Assessment: post-procedure vital signs reviewed and stable Respiratory status: spontaneous breathing, nonlabored ventilation and respiratory function stable Cardiovascular status: blood pressure returned to baseline and stable Postop Assessment: epidural receding and no apparent nausea or vomiting Anesthetic complications: no    Last Vitals:  Vitals:   06/06/18 0945 06/06/18 1005  BP: (!) 108/54 123/75  Pulse: 94 93  Resp: 14 18  Temp:    SpO2: 100% 100%    Last Pain:  Vitals:   06/06/18 0858  TempSrc: Oral   Pain Goal:                 Novelle Addair,E. Rehema Muffley

## 2018-06-07 DIAGNOSIS — Z98891 History of uterine scar from previous surgery: Secondary | ICD-10-CM

## 2018-06-07 LAB — CBC
HCT: 28.8 % — ABNORMAL LOW (ref 36.0–46.0)
Hemoglobin: 9.2 g/dL — ABNORMAL LOW (ref 12.0–15.0)
MCH: 28 pg (ref 26.0–34.0)
MCHC: 31.9 g/dL (ref 30.0–36.0)
MCV: 87.5 fL (ref 80.0–100.0)
Platelets: 165 10*3/uL (ref 150–400)
RBC: 3.29 MIL/uL — ABNORMAL LOW (ref 3.87–5.11)
RDW: 15.9 % — ABNORMAL HIGH (ref 11.5–15.5)
WBC: 10.8 10*3/uL — AB (ref 4.0–10.5)
nRBC: 0 % (ref 0.0–0.2)

## 2018-06-07 LAB — BIRTH TISSUE RECOVERY COLLECTION (PLACENTA DONATION)

## 2018-06-07 MED ORDER — MAGNESIUM OXIDE 400 (241.3 MG) MG PO TABS
400.0000 mg | ORAL_TABLET | Freq: Every day | ORAL | Status: DC
  Administered 2018-06-07 – 2018-06-09 (×3): 400 mg via ORAL
  Filled 2018-06-07 (×4): qty 1

## 2018-06-07 MED ORDER — FERROUS SULFATE 325 (65 FE) MG PO TABS
325.0000 mg | ORAL_TABLET | Freq: Two times a day (BID) | ORAL | Status: DC
  Administered 2018-06-07: 325 mg via ORAL
  Filled 2018-06-07: qty 1

## 2018-06-07 NOTE — Lactation Note (Signed)
This note was copied from a baby's chart. Lactation Consultation Note  Patient Name: Courtney Peters Date: 06/07/2018 Reason for consult: Follow-up assessment;1st time breastfeeding;Primapara;Term;Infant weight loss;Maternal endocrine disorder Type of Endocrine Disorder?: Thyroid, with tx . P1  Baby is 8627 hours old  LC called due to baby having a difficult latch.  As LC entered the room , LC noted a bottle of formula and per dad was given formula due to the  Baby not feeding.  Baby awake, near the breast fully dressed.  LC reviewed basics - and recommended prior to latch - check the diaper if needed,  An feed STS until the baby is gaining back to birth weight, gaining steadily, and can stay awake for  Majority of feeding. LC reviewed the benefits of STS. Firm support. Prior to latch breast massage, hand  Express, pre-pump if needed to prime the milk ducts and latch with firm support.  When latching tickle the baby's upper lip until the baby opens wide/ latch with firm support and breast  Compressions / swallows. Stimulate as needed to keep the baby in a active intermittent feeding pattern.  Discussed nutritive vs non - nutritive feeding patterns and the importance of hanging out latched.  Baby latched with depth / football / with assistance / and showing dad how he could be the breastfeeding helper To latch if  Needed ( dad receptive ). Baby fed for 8 mins with multiple swallows/ increased with breast compressions.  Baby released on her own and was satisfied.  @ consult LC changed a mec stool/ and reviewed with mom and dad how to change a diaper.   LC feels this mom and dad will need review with breast feeding basics several times and assist with latch as needed.  LC [facility] - T. Adela GlimpseBernard of the above plan.    Maternal Data Has patient been taught Hand Expression?: Yes  Feeding Feeding Type: Breast Fed  LATCH Score Latch: Grasps breast easily, tongue  down, lips flanged, rhythmical sucking.  Audible Swallowing: Spontaneous and intermittent  Type of Nipple: Everted at rest and after stimulation  Comfort (Breast/Nipple): Soft / non-tender  Hold (Positioning): Assistance needed to correctly position infant at breast and maintain latch.(worked on positioning and depth )  LATCH Score: 9  Interventions Interventions: Breast feeding basics reviewed;Assisted with latch;Skin to skin;Breast massage;Hand express;Breast compression;Adjust position;Support pillows;Position options;Hand pump;Shells  Lactation Tools Discussed/Used Tools: Shells;Pump Shell Type: Inverted Breast pump type: Manual Pump Review: Setup, frequency, and cleaning Initiated by:: MAI  Date initiated:: 06/07/18   Consult Status Consult Status: Follow-up Date: 06/08/18 Follow-up type: In-patient    Courtney Peters 06/07/2018, 11:13 AM

## 2018-06-07 NOTE — Anesthesia Postprocedure Evaluation (Signed)
Anesthesia Post Note  Patient: Courtney Peters  Procedure(s) Performed: CESAREAN SECTION (N/A )     Patient location during evaluation: Mother Baby Anesthesia Type: Epidural Level of consciousness: awake and alert Pain management: pain level controlled Vital Signs Assessment: post-procedure vital signs reviewed and stable Respiratory status: spontaneous breathing, nonlabored ventilation and respiratory function stable Cardiovascular status: stable Postop Assessment: no headache, no backache, epidural receding, no apparent nausea or vomiting, patient able to bend at knees, able to ambulate and adequate PO intake Anesthetic complications: no    Last Vitals:  Vitals:   06/07/18 0243 06/07/18 0500  BP: 95/65 98/67  Pulse: 93 89  Resp: 18 18  Temp: 36.7 C 36.7 C  SpO2: 95% 95%    Last Pain:  Vitals:   06/07/18 0750  TempSrc:   PainSc: 0-No pain   Pain Goal: Patients Stated Pain Goal: 5 (06/06/18 1400)               Damarion Mendizabal Hristova

## 2018-06-07 NOTE — Progress Notes (Addendum)
POSTOPERATIVE DAY # 1 S/P Primary LTCS for NRFHR, baby girl    S:         Reports feeling tired and sore             Tolerating po intake / no nausea / no vomiting / + flatus / no BM  Denies dizziness, SOB, or CP             Bleeding is light             Pain controlled with Motrin             Up ad lib / ambulatory/ has voided x 1 since urinary catheter has been removed without difficulty  Newborn breast feeding - I assisted with latching while in the room with the football hold; having some difficulty with positioning and latching    O:  VS: BP 98/67   Pulse 89   Temp 98 F (36.7 C) (Oral)   Resp 18   Ht 5\' 3"  (1.6 m)   Wt 87.6 kg   LMP 08/25/2017   SpO2 95%   Breastfeeding Unknown   BMI 34.21 kg/m    LABS:               Recent Labs    06/05/18 1642 06/07/18 0547  WBC 7.6 10.8*  HGB 11.3* 9.2*  PLT 197 165               Bloodtype: --/--/A POS, A POS Performed at [facility], [address] [place], [place] [address]  [phone](12/20 1642)  Rubella: Immune (12/21 0000)                                             I&O: Intake/Output      12/21 0701 - 12/22 0700 12/22 0701 - 12/23 0700   P.O. 1320    I.V. (mL/kg) 2475 (28.3)    Total Intake(mL/kg) 3795 (43.3)    Urine (mL/kg/hr) 3475 (1.7) 900 (1.7)   Blood 247    Total Output 3722 900   Net +73 -900         Flu and Tdap UTD             Physical Exam:             Alert and Oriented X3  Lungs: Clear and unlabored  Heart: regular rate and rhythm / no murmurs  Abdomen: soft, non-tender, non-distended, hypoactive bowel sounds in all quadrants, mild distention              Fundus: firm, non-tender, U-1             Dressing : honeycomb with steri-strips old shadowing noted otherwise dry and intact               Incision:  approximated with sutures / no erythema / no ecchymosis / no drainage; mild edema noted over mons pubis and pannus  Perineum: intact  Lochia: small, no clots  Extremities: +1 BLE edema, no calf  pain or tenderness,   A:        POD # 1 S/P Primary LTCS            Hypothyroidism - on Synthroid  ABL Anemia   P:        Routine postoperative care  Ferrous sulfate 325mg  PO BID WC  Magnesium oxide 400mg  PO daily  See lactation today   May shower today   Encouraged warm liquids and ambulation to promote bowel motility  Continue current care   Carlean JewsMeredith Sigmon, MSN, CNM [facility]

## 2018-06-07 NOTE — Addendum Note (Signed)
Addendum  created 06/07/18 0926 by Elgie CongoMalinova, Reilyn Nelson H, CRNA   Charge Capture section accepted, Clinical Note Signed

## 2018-06-08 MED ORDER — BISACODYL 10 MG RE SUPP: 10.0000 mg | Freq: Every day | RECTAL | Status: DC | PRN

## 2018-06-08 MED ORDER — FERROUS SULFATE 325 (65 FE) MG PO TABS
325.0000 mg | ORAL_TABLET | Freq: Every day | ORAL | Status: DC
  Administered 2018-06-08 – 2018-06-09 (×2): 325 mg via ORAL
  Filled 2018-06-08 (×2): qty 1

## 2018-06-08 NOTE — Lactation Note (Signed)
This note was copied from a baby's chart. Lactation Consultation Note  Patient Name: Courtney Peters's Date: 06/08/2018 Reason for consult: Follow-up assessment;1st time breastfeeding;Primapara;Term;Infant weight loss;Maternal endocrine disorder(8% weight loss ) Type of Endocrine Disorder?: Thyroid(with  tx )  Baby is 4453 hours old As LC entered the room mom resting in bed and baby in the crib asleep.  Per mom the baby recently fed at the breast for 15 mins and supplemented with 15 ml of formula per MD order per mom.  Mom also mentioned she is hand expressing and was able to give the baby 2 ml of EBM prior to feed at the breast.  Per mom mentioned her nipple is sore, with permission assessed and noted she had applied coconut oil and wearing shells.  LC reminded mom to use her EBM 1st and then a dab a coconut oil. No breakdown noted and areola compressible / easily expressed milk.  LC reviewed jaundice and the importance of feeding STS until the baby gains back to birth weight and is gaining steadily , also  Can stay awake for the majority of the feeding.   LC encouraged mom to call when baby is showing feeding cues .   LC Plan :  Breast shells between feedings except when sleeping  Feed with feeding cues and by 3 hours due to weight loss - STS  Feed 15 -20 mins , ( 30 mins max ) and supplement  With EBM or formula  Post pump both breast for 15 -20 mins after feeding.    Maternal Data Has patient been taught Hand Expression?: Yes  Feeding Feeding Type: (pe rmom baby last fed at 1245 )  LATCH Score  ( Latch Score by the [facility] - was 6 the previous LC was 8 at 0910 this am  Latch: Repeated attempts needed to sustain latch, nipple held in mouth throughout feeding, stimulation needed to elicit sucking reflex.  Audible Swallowing: A few with stimulation  Type of Nipple: Everted at rest and after stimulation  Comfort (Breast/Nipple): Filling, red/small blisters or bruises,  mild/mod discomfort  Hold (Positioning): Assistance needed to correctly position infant at breast and maintain latch.  LATCH Score: 6  Interventions Interventions: Breast feeding basics reviewed  Lactation Tools Discussed/Used Tools: Shells;Pump Shell Type: Inverted Breast pump type: Double-Electric Breast Pump;Manual   Consult Status Consult Status: Follow-up Date: 06/08/18 Follow-up type: In-patient    Courtney Peters 06/08/2018, 1:16 PM

## 2018-06-08 NOTE — Progress Notes (Signed)
POSTOPERATIVE DAY # 2 S/P CS   S:         Reports feeling really sore - more pain today              Tolerating po intake / no nausea / no vomiting / little only flatus / no BM but feels like needs to go             Bleeding is light             Pain controlled with motrin and oxycodone             Up ad lib / ambulatory/ voiding QS  Newborn Bottle, Breast and formula supplementation  O:  VS: BP 109/75   Pulse 80   Temp 98.4 F (36.9 C) (Oral)   Resp 18   Ht 5\' 3"  (1.6 m)   Wt 87.6 kg   LMP 08/25/2017   SpO2 100%   Breastfeeding Unknown   BMI 34.21 kg/m    LABS:              Recent Labs    06/05/18 1642 06/07/18 0547  WBC 7.6 10.8*  HGB 11.3* 9.2*  PLT 197 165               Bloodtype: --/--/A POS, A POS Performed at [facility], [address] [place], [place] [address]  [phone](12/20 1642)  Rubella: Immune (12/21 0000)                                            I&O: Intake/Output      12/22 0701 - 12/23 0700 12/23 0701 - 12/24 0700   P.O.     I.V. (mL/kg)     Total Intake(mL/kg)     Urine (mL/kg/hr) 900 (0.4)    Blood     Total Output 900    Net -900                    Physical Exam:             Alert and Oriented X3  Lungs: Clear and unlabored  Heart: regular rate and rhythm / no mumurs  Abdomen: soft, non-tender, mildly distended, hypoactive BS             Fundus: firm, non-tender, Ueven             Dressing intact honeycomb              Incision:  approximated with suture / no erythema / no ecchymosis / no drainage  Perineum: intact  Lochia: light  Extremities: 1+ edema, no calf pain or tenderness  A:        POD # 2 S/P CS - inadequate pain control with increased gas pain            ABL anemia  P:        Routine postoperative care              Promote bowel motility / adjust Motrin dose / advance post-op activity             Anticipate DC tomorrow   Marlinda Mikeanya Reggie Welge CNM, MSN, [facility] 06/08/2018, 8:39 AM

## 2018-06-09 DIAGNOSIS — O9902 Anemia complicating childbirth: Secondary | ICD-10-CM | POA: Diagnosis not present

## 2018-06-09 DIAGNOSIS — E039 Hypothyroidism, unspecified: Secondary | ICD-10-CM | POA: Diagnosis present

## 2018-06-09 MED ORDER — FERROUS SULFATE 325 (65 FE) MG PO TABS: 325.0000 mg | ORAL_TABLET | Freq: Every day | ORAL | 3 refills | Status: DC

## 2018-06-09 MED ORDER — COCONUT OIL OIL: 1.0000 "application " | TOPICAL_OIL | 0 refills | Status: DC | PRN

## 2018-06-09 MED ORDER — SIMETHICONE 80 MG PO CHEW: 80.0000 mg | CHEWABLE_TABLET | ORAL | 0 refills | Status: DC | PRN

## 2018-06-09 MED ORDER — OXYCODONE HCL 5 MG PO TABS: 5.0000 mg | ORAL_TABLET | ORAL | 0 refills | Status: DC | PRN

## 2018-06-09 MED ORDER — SENNOSIDES-DOCUSATE SODIUM 8.6-50 MG PO TABS: 2.0000 | ORAL_TABLET | ORAL | Status: DC

## 2018-06-09 NOTE — Progress Notes (Addendum)
Nauseous,unable to stand or progress diet at this time

## 2018-06-09 NOTE — Discharge Summary (Addendum)
OB Discharge Summary  Patient Name: Courtney Peters DOB: [birth year] MRN: [id number]  Date of admission: 06/05/2018 Delivering provider: Greta Yung   Date of discharge: 06/09/2018  Admitting diagnosis: postdates Intrauterine pregnancy: 7039w5d     Secondary diagnosis:Principal Problem:   Postpartum care following cesarean delivery (12/21) Active Problems:   S/P cesarean section   Maternal anemia, with delivery   Hypothyroidism  Additional problems:none     Discharge diagnosis:  Patient Active Problem List   Diagnosis Date Noted  . Maternal anemia, with delivery 06/09/2018  . Hypothyroidism 06/09/2018  . S/P cesarean section 06/07/2018  . Postpartum care following cesarean delivery (12/21) 06/06/2018  . Counseling of expectant parents at pediatric pre-birth visit 04/25/2018                                                                Post partum procedures:none  Augmentation: Pitocin, Cytotec and Foley Balloon Pain control: Epidural  Laceration:None  Episiotomy:None  Complications: None  Hospital course:  Induction of Labor With Cesarean Section  32 y.o. yo G2P1011 at 6939w5d was admitted to the hospital 06/05/2018 for induction of labor. Patient had a labor course significant for cervical balloon, misoprostol orally, and Pitocin augmentation. The patient went for cesarean section due to fetal intolerance to labor and delivered a Viable infant,06/06/2018  Membrane Rupture Time/Date: 1:10 AM ,06/06/2018   Details of operation can be found in separate operative Note.  Patient had an uncomplicated postpartum course. She is ambulating, tolerating a regular diet, passing flatus, and urinating well.  Patient is discharged home in stable condition on 06/09/18.                                    Physical exam  Vitals:   06/07/18 2147 06/08/18 0618 06/08/18 2129 06/09/18 0550  BP: 133/86 109/75 135/85 130/79  Pulse: 80 80 85 77  Resp:  18 16 18   Temp: 98.7 F (37.1 C)  98.4 F (36.9 C) 98.6 F (37 C) 97.7 F (36.5 C)  TempSrc: Oral Oral Oral Oral  SpO2: 100%  98% 100%  Weight:      Height:       General: alert, cooperative and no distress Lochia: appropriate Uterine Fundus: firm Incision: Healing well with no significant drainage DVT Evaluation: No cords or calf tenderness. No significant calf/ankle edema. Labs: Lab Results  Component Value Date   WBC 10.8 (H) 06/07/2018   HGB 9.2 (L) 06/07/2018   HCT 28.8 (L) 06/07/2018   MCV 87.5 06/07/2018   PLT 165 06/07/2018   No flowsheet data found.  Vaccines: TDaP UTD         Flu    UTD  Discharge instruction: per After Visit Summary and "Baby and Me Booklet".  After Visit Meds:  Allergies as of 06/09/2018   No Known Allergies     Medication List    TAKE these medications   acetaminophen 325 MG tablet Commonly known as:  TYLENOL Take 650 mg by mouth every 6 (six) hours as needed.   cholecalciferol 25 MCG (1000 UT) tablet Commonly known as:  VITAMIN D3 Take 1,000 Units by mouth at bedtime.   coconut oil Oil Apply 1 application topically as  needed.   ferrous sulfate 325 (65 FE) MG tablet Take 1 tablet (325 mg total) by mouth daily with breakfast. Start taking on:  June 10, 2018   levothyroxine 100 MCG tablet Commonly known as:  SYNTHROID, LEVOTHROID Take 100 mcg by mouth daily before breakfast.   magnesium oxide 400 MG tablet Commonly known as:  MAG-OX Take 400 mg by mouth at bedtime.   oxyCODONE 5 MG immediate release tablet Commonly known as:  Oxy IR/ROXICODONE Take 1-2 tablets (5-10 mg total) by mouth every 4 (four) hours as needed for moderate pain.   prenatal multivitamin Tabs tablet Take 1 tablet by mouth at bedtime.   senna-docusate 8.6-50 MG tablet Commonly known as:  Senokot-S Take 2 tablets by mouth daily. Start taking on:  June 10, 2018   simethicone 80 MG chewable tablet Commonly known as:  MYLICON Chew 1 tablet (80 mg total) by mouth as needed  for flatulence.       Diet: routine diet  Activity: Advance as tolerated. Pelvic rest for 6 weeks.   Postpartum contraception: Not Discussed  Newborn Data: Live born female  Birth Weight: 8 lb 8.3 oz (3865 g) APGAR: 6, 8  Newborn Delivery   Birth date/time:  06/06/2018 08:09:00 Delivery type:  C-Section, Low Transverse C-section categorization:  Primary     named Rose Baby Feeding: Bottle and Breast Disposition:remains inpatient for phototherapy, mother rooming in   Delivery Report:  Review the Delivery Report for details.    Follow up: Follow-up Information    Maxie Betterousins, Tyronda Vizcarrondo, MD. Schedule an appointment as soon as possible for a visit in 6 week(s).   Specialty:  Obstetrics and Gynecology Contact information: [address] [place] [address] [phone]             Signed: Cipriano Mileaniela C Paul, CNM, MSN 06/09/2018, 10:33 AM

## 2018-06-09 NOTE — Progress Notes (Addendum)
Nauseous, unable to stand or progress diet.

## 2018-06-09 NOTE — Progress Notes (Signed)
Patient ID: Courtney Peters, female   DOB: 1985/07/07, 32 y.o.   MRN: [id number] Subjective: POD# 3 Live born female  Birth Weight: 8 lb 8.3 oz (3865 g) APGAR: 6, 8  Newborn Delivery   Birth date/time:  06/06/2018 08:09:00 Delivery type:  C-Section, Low Transverse C-section categorization:  Primary    Baby name: Rose Delivering provider: COUSINS, SHERONETTE   Feeding: breast, bottle  Pain control at delivery: Epidural   Reports feeling well.  Patient reports tolerating PO.   Breast symptoms: + colostrum Pain controlled with PO meds Denies HA/SOB/C/P/N/V/dizziness. Flatus present. She reports vaginal bleeding as normal, without clots.  She is ambulating, urinating without difficulty.     Objective:   VS:    Vitals:   06/07/18 2147 06/08/18 0618 06/08/18 2129 06/09/18 0550  BP: 133/86 109/75 135/85 130/79  Pulse: 80 80 85 77  Resp:  18 16 18   Temp: 98.7 F (37.1 C) 98.4 F (36.9 C) 98.6 F (37 C) 97.7 F (36.5 C)  TempSrc: Oral Oral Oral Oral  SpO2: 100%  98% 100%  Weight:      Height:        No intake or output data in the 24 hours ending 06/09/18 0911      Recent Labs    06/07/18 0547  WBC 10.8*  HGB 9.2*  HCT 28.8*  PLT 165     Blood type: --/--/A POS, A POS Performed at [facility], [address] [place], [place] [address]  (12/20 1642)  Rubella: Immune (12/21 0000)  Vaccines: TDaP UTD         Flu    UTD   Physical Exam:  General: alert, cooperative and no distress Abdomen: soft, nontender, normal bowel sounds Incision: dry, intact and minimal old drainage Uterine Fundus: firm, below umbilicus, nontender Lochia: minimal Ext: Homans sign is negative, no sign of DVT and no edema, redness or tenderness in the calves or thighs      Assessment/Plan: 32 y.o.   POD# 3. [id number]                  Principal Problem:   Postpartum care following cesarean delivery (12/21)  - indication: fetal intolerance to labor Active Problems:   S/P  cesarean section   Maternal anemia, with delivery  - oral Fe and Mag Ox  Doing well, stable.    Routine post-op care             DC home today w/ instructions  [facility] and PRN   Neta Mendsaniela C Dezaree Tracey, CNM, MSN 06/09/2018, 9:11 AM

## 2018-06-09 NOTE — Progress Notes (Addendum)
Nauseous, did not stand up patient to ambulate

## 2018-06-10 ENCOUNTER — Ambulatory Visit: Payer: Self-pay

## 2018-06-10 NOTE — Lactation Note (Signed)
This note was copied from a baby's chart. Lactation Consultation Note  Patient Name: Girl Sanita Estrada ETUYW'S Date: 06/10/2018  Infant swaddled in crib. Baby girl Kalman Shan now 67 days old. Parents report mom did not attempt to breastfed her this last feeding time that they just fed her EMM and formula.  Mom reports still struggling with breastfeeding. Parents report she ate at approximately two hours ago.  Asked if we could try breastfeeding.  Parents in agreement.  Assist with cradle hold on left breast.  Infant latches and breastfeed well. Mom reports comfort. After about 5 minutes infant let go satiated, arms to side. Milk running out of side of mouth slightly.  Mom has sprays of colostrum/transitional milk. Urged mom to always try and breastfeed first.  Discussed prepumping to help elongate nipple and get milk there.  Mom already has manual pump for this and showed how to use conversion kit as manual pump also. Parents report having DEBP Lansinoh at home for use.Then pump/hand express and feed back all EMM .  Left mom and baby STS.  Urged mom to follow up with outpatient lactation.   Maternal Data    Feeding    LATCH Score                   Interventions    Lactation Tools Discussed/Used     Consult Status      Izzabell Klasen Thompson Caul 06/10/2018, 10:05 AM

## 2018-06-10 NOTE — Lactation Note (Signed)
This note was copied from a baby's chart. Lactation Consultation Note  Patient Name: Courtney Peters: 06/10/2018 Reason for consult: Follow-up assessment   Baby 704 days old. Off phototherapy. Reviewed waking techniques. Mother latched baby in both football and cross cradle hold. Helped w/ breast support and compression. Sucks and swallows observed. FOB will give breastmilk w/ slow flow nipple after breastfeeding. Discussed paced feeding. Reviewed engorgement care and monitoring voids/stools.    Maternal Data Has patient been taught Hand Expression?: Yes  Feeding Feeding Type: Breast Fed  LATCH Score Latch: Grasps breast easily, tongue down, lips flanged, rhythmical sucking.  Audible Swallowing: A few with stimulation  Type of Nipple: Everted at rest and after stimulation  Comfort (Breast/Nipple): Filling, red/small blisters or bruises, mild/mod discomfort  Hold (Positioning): Assistance needed to correctly position infant at breast and maintain latch.  LATCH Score: 7  Interventions Interventions: Breast feeding basics reviewed;Assisted with latch;Skin to skin;Pre-pump if needed;Support pillows;Position options;Expressed milk;DEBP  Lactation Tools Discussed/Used     Consult Status Consult Status: Complete Peters: 06/10/18    Dahlia ByesBerkelhammer, Molly Maselli [facility] 06/10/2018, 2:36 PM

## 2018-06-10 NOTE — Lactation Note (Signed)
This note was copied from a baby's chart. Lactation Consultation Note  Patient Name: Courtney Peters Reason for consult: Follow-up assessment   Mother recently pumped 20 ml.  Mother is breastfeeding, pumping and supplementing w/ formula. Mom has my # to call for assist w/next feeding.    Maternal Data    Feeding Feeding Type: Breast Fed  LATCH Score Latch: Grasps breast easily, tongue down, lips flanged, rhythmical sucking.  Audible Swallowing: A few with stimulation  Type of Nipple: Everted at rest and after stimulation  Comfort (Breast/Nipple): Filling, red/small blisters or bruises, mild/mod discomfort  Hold (Positioning): Assistance needed to correctly position infant at breast and maintain latch.  LATCH Score: 7  Interventions Interventions: Assisted with latch;Hand express;Pre-pump if needed;Expressed milk;Hand pump;DEBP  Lactation Tools Discussed/Used     Consult Status Consult Status: PRN Date: 06/10/18 Follow-up type: Call as needed    Dahlia ByesBerkelhammer, Dyllan Hughett [facility] Peters, 1:13 PM

## 2018-07-29 ENCOUNTER — Ambulatory Visit: Payer: Self-pay

## 2018-07-29 NOTE — Lactation Note (Signed)
This note was copied from a baby's chart. Lactation Consultation Note  Patient Name: Courtney Peters HWYSH'U Date: 07/29/2018   24 week old infant presents today with mom and dad for feeding assessment. Mom reports infant has not been latching as well during the day starting at 57 weeks of age. Per parents infant has been given formula since being in the hospital.   Infant has gained 944 grams in the last 50 days with an average daily weight gain of 19 grams a day. Infant has gained 161 grams in the last 20 days since 1 month appt at Pediatricians office for an average daily weight gain of 8 grams a day. Discussed with parents infant needs more calories.   Mom reports infant BF only at night for 5-10 minutes. Infant has been refusing the breast during the day and if she will latch she feeds for about 5 minutes at the most. Infant pulls off the breast and cries. Most of the time she is supplemented mostly with EBM, enc mom to increase calories and use formula as needed. Mom is not supplementing infant at night, enc parents to supplement after each BF due to infant weight.   Infant latched to the left breast in the cross cradle position. Infant pulled on and off the breast. She did transfer 45 ml quickly and then would not relatch. Infant was still hungry and was bottle fed the remaining amount. Infant tolerated bottle feeding well.   Infant with thick labial frenulum that inserts at the bottom of th egum ridge, upper lip tight with flanging. Mom reports no pain with BF. Infant with sucking blister to center upper lip. Infant with good tongue extension and lateralization. Infant with decreased mid tongue elevation. Infant with weak suck on gloved finger before and after feeding. Infant with tongue thrusting and needs organization at the beginning of feeding. Infant leaked a lot on the breast. Infant chokes and leaks on the bottle per parents, more at the beginning and end of the feeding. Infant is not  excessively gassy or has trouble laying on her back after feedings per parents, may increase once volumes are increased. Parents given information on tongue and lip restrictions and how it can effect milk supply and growth. Parents to decide if they wish to have infant evaluated.   Mom has spoken with Dr. Cherly Hensen about her milk supply. She was taking Goats Rue and did not notice an increase in milk supply. She has started Fenugreek 600 mg capsules 3 TID on 2/ 10. She has not seen an increase in her milk supply.  Enc mom to keep taking and give it more time. She and Dr Cherly Hensen have discussed Reglan but would like to try Fenugreek first, discussed side effects of Reglan.   Mom is pumping every 2-3 hours during the day with Medela PIS. She is using # 30 flanges, advised mom to decrease to # 24 flanges as they are a better fit for her. Enc mom to power pump and not to go > 6 hours with no pumping at night. Reviewed  Hands free bra and hands on pumping.   Infant to follow up with Lactation as needed or 1-5 days post tongue and lip releases if completed. Mom aware to call with questions/concerns as needed. Infant to follow up with Dr. Barney Drain next week. Mom given information on BF Support Groups.   Dad very helpful to mom. Mom independent with feeding infant. Dad's mom is in town to help with  infant care. Enc mom to allow family to help with bottle feeding while mom BF and pumping.   Mom feels she is eating and drinking well.   Parents report all questions/concerns have been answered at this time.      Courtney Peters Courtney Peters 07/29/2018, 11:46 AM

## 2018-12-01 ENCOUNTER — Encounter: Payer: Managed Care, Other (non HMO) | Admitting: Family Medicine

## 2018-12-02 NOTE — Progress Notes (Signed)
This encounter was created in error - please disregard.

## 2019-02-15 ENCOUNTER — Telehealth: Payer: Self-pay | Admitting: Family Medicine

## 2019-02-15 NOTE — Telephone Encounter (Signed)
Left a voicemail for the patient to call back to schedule appointment to establish care. I also advise the patient of our no show policy via voicemail. Awaiting call back. [facility] can schedule.

## 2019-02-15 NOTE — Telephone Encounter (Signed)
Sure.  Sibley Rolison, MD [facility]   

## 2019-02-15 NOTE — Telephone Encounter (Signed)
Please advise 

## 2019-02-15 NOTE — Telephone Encounter (Signed)
Please advise if patient can be rescheduled for a new patient appointment. Patient was a no show virtually in June for Dr. Jonni Sanger. Patient requests Dr. Rogers Blocker.   Copied from [place] [phone]. Topic: Appointment Scheduling - Scheduling Inquiry for Clinic >> Feb 15, 2019  1:57 PM Sheran Luz wrote: Patient would like to establish care with Dr. Rogers Blocker. Unable to schedule due to patient having a no show appointment with Dr. Jonni Sanger.

## 2019-03-15 ENCOUNTER — Other Ambulatory Visit: Payer: Self-pay

## 2019-03-15 ENCOUNTER — Encounter: Payer: Self-pay | Admitting: Family Medicine

## 2019-03-15 ENCOUNTER — Ambulatory Visit (INDEPENDENT_AMBULATORY_CARE_PROVIDER_SITE_OTHER): Payer: Managed Care, Other (non HMO) | Admitting: Family Medicine

## 2019-03-15 VITALS — BP 112/74 | HR 69 | Temp 98.2°F | Ht 63.0 in | Wt 147.8 lb

## 2019-03-15 DIAGNOSIS — E039 Hypothyroidism, unspecified: Secondary | ICD-10-CM | POA: Diagnosis not present

## 2019-03-15 DIAGNOSIS — Z0000 Encounter for general adult medical examination without abnormal findings: Secondary | ICD-10-CM

## 2019-03-15 DIAGNOSIS — G2581 Restless legs syndrome: Secondary | ICD-10-CM | POA: Diagnosis not present

## 2019-03-15 DIAGNOSIS — Z23 Encounter for immunization: Secondary | ICD-10-CM | POA: Diagnosis not present

## 2019-03-15 LAB — LIPID PANEL
Cholesterol: 184 mg/dL (ref 0–200)
HDL: 37.1 mg/dL — ABNORMAL LOW (ref >39.00)
LDL Cholesterol: 123 mg/dL — ABNORMAL HIGH (ref 0–99)
NonHDL: 147.3
Total CHOL/HDL Ratio: 5
Triglycerides: 121 mg/dL (ref 0.0–149.0)
VLDL: 24.2 mg/dL (ref 0.0–40.0)

## 2019-03-15 LAB — CBC WITH DIFFERENTIAL/PLATELET
Basophils Absolute: 0.1 10*3/uL (ref 0.0–0.1)
Basophils Relative: 0.8 % (ref 0.0–3.0)
Eosinophils Absolute: 0.4 10*3/uL (ref 0.0–0.7)
Eosinophils Relative: 5.9 % — ABNORMAL HIGH (ref 0.0–5.0)
HCT: 38.8 % (ref 36.0–46.0)
Hemoglobin: 13.1 g/dL (ref 12.0–15.0)
Lymphocytes Relative: 46 % (ref 12.0–46.0)
Lymphs Abs: 3 10*3/uL (ref 0.7–4.0)
MCHC: 33.8 g/dL (ref 30.0–36.0)
MCV: 90.3 fl (ref 78.0–100.0)
Monocytes Absolute: 0.4 10*3/uL (ref 0.1–1.0)
Monocytes Relative: 6.2 % (ref 3.0–12.0)
Neutro Abs: 2.7 10*3/uL (ref 1.4–7.7)
Neutrophils Relative %: 41.1 % — ABNORMAL LOW (ref 43.0–77.0)
Platelets: 189 10*3/uL (ref 150.0–400.0)
RBC: 4.29 Mil/uL (ref 3.87–5.11)
RDW: 12.6 % (ref 11.5–15.5)
WBC: 6.5 10*3/uL (ref 4.0–10.5)

## 2019-03-15 LAB — COMPREHENSIVE METABOLIC PANEL
ALT: 13 U/L (ref 0–35)
AST: 13 U/L (ref 0–37)
Albumin: 4.5 g/dL (ref 3.5–5.2)
Alkaline Phosphatase: 66 U/L (ref 39–117)
BUN: 8 mg/dL (ref 6–23)
CO2: 26 mEq/L (ref 19–32)
Calcium: 9.3 mg/dL (ref 8.4–10.5)
Chloride: 105 mEq/L (ref 96–112)
Creatinine, Ser: 0.78 mg/dL (ref 0.40–1.20)
GFR: 85.08 mL/min (ref >60.00)
Glucose, Bld: 89 mg/dL (ref 70–99)
Potassium: 3.9 mEq/L (ref 3.5–5.1)
Sodium: 140 mEq/L (ref 135–145)
Total Bilirubin: 0.7 mg/dL (ref 0.2–1.2)
Total Protein: 6.8 g/dL (ref 6.0–8.3)

## 2019-03-15 LAB — VITAMIN D 25 HYDROXY (VIT D DEFICIENCY, FRACTURES): VITD: 26.79 ng/mL — ABNORMAL LOW (ref 30.00–100.00)

## 2019-03-15 LAB — TSH: TSH: 1.1 u[IU]/mL (ref 0.35–4.50)

## 2019-03-15 LAB — T4, FREE: Free T4: 1.17 ng/dL (ref 0.60–1.60)

## 2019-03-15 LAB — FERRITIN: Ferritin: 40.1 ng/mL (ref 10.0–291.0)

## 2019-03-15 MED ORDER — ROPINIROLE HCL 0.5 MG PO TABS: 0.5000 mg | ORAL_TABLET | Freq: Every day | ORAL | 1 refills | Status: AC

## 2019-03-15 NOTE — Patient Instructions (Signed)
Let's get labs back first and if iron deficient we can replace this and see if it helps. If this normal, start medication. You will start at night about 30 min-1 hour before bed. Take one pill at 0.5mg . may titrate up every week by .5mg . do not go over 2mg .   See you back in 1 month via virtual.   Restless Legs Syndrome Restless legs syndrome is a condition that causes uncomfortable feelings or sensations in the legs, especially while sitting or lying down. The sensations usually cause an overwhelming urge to move the legs. The arms can also sometimes be affected. The condition can range from mild to severe. The symptoms often interfere with a person's ability to sleep. What are the causes? The cause of this condition is not known. What increases the risk? The following factors may make you more likely to develop this condition:  Being older than 50.  Pregnancy.  Being a woman. In general, the condition is more common in women than in men.  A family history of the condition.  Having iron deficiency.  Overuse of caffeine, nicotine, or alcohol.  Certain medical conditions, such as kidney disease, Parkinson's disease, or nerve damage.  Certain medicines, such as those for high blood pressure, nausea, colds, allergies, depression, and some heart conditions. What are the signs or symptoms? The main symptom of this condition is uncomfortable sensations in the legs, such as:  Pulling.  Tingling.  Prickling.  Throbbing.  Crawling.  Burning. Usually, the sensations:  Affect both sides of the body.  Are worse when you sit or lie down.  Are worse at night. These may wake you up or make it difficult to fall asleep.  Make you have a strong urge to move your legs.  Are temporarily relieved by moving your legs. The arms can also be affected, but this is rare. People who have this condition often have tiredness during the day because of their lack of sleep at night. How is this  diagnosed? This condition may be diagnosed based on:  Your symptoms.  Blood tests. In some cases, you may be monitored in a sleep lab by a specialist (a sleep study). This can detect any disruptions in your sleep. How is this treated? This condition is treated by managing the symptoms. This may include:  Lifestyle changes, such as exercising, using relaxation techniques, and avoiding caffeine, alcohol, or tobacco.  Medicines. Anti-seizure medicines may be tried first. Follow these instructions at home:     General instructions  Take over-the-counter and prescription medicines only as told by your health care provider.  Use methods to help relieve the uncomfortable sensations, such as: ? Massaging your legs. ? Walking or stretching. ? Taking a cold or hot bath.  Keep all follow-up visits as told by your health care provider. This is important. Lifestyle  Practice good sleep habits. For example, go to bed and get up at the same time every day. Most adults should get 7-9 hours of sleep each night.  Exercise regularly. Try to get at least 30 minutes of exercise most days of the week.  Practice ways of relaxing, such as yoga or meditation.  Avoid caffeine and alcohol.  Do not use any products that contain nicotine or tobacco, such as cigarettes and e-cigarettes. If you need help quitting, ask your health care provider. Contact a health care provider if:  Your symptoms get worse or they do not improve with treatment. Summary  Restless legs syndrome is a condition  that causes uncomfortable feelings or sensations in the legs, especially while sitting or lying down.  The symptoms often interfere with a person's ability to sleep.  This condition is treated by managing the symptoms. You may need to make lifestyle changes or take medicines. This information is not intended to replace advice given to you by your health care provider. Make sure you discuss any questions you have  with your health care provider. Document Released: 05/24/2002 Document Revised: 06/23/2017 Document Reviewed: 06/23/2017 Elsevier Patient Education  2020 [employer].

## 2019-03-15 NOTE — Progress Notes (Signed)
Patient: Courtney Peters MRN: [id number] DOB: 1985/12/23 PCP: Orland Mustard, MD     Subjective:  Chief Complaint  Patient presents with  . Establish Care  . Annual Exam    HPI: The patient is a 33 y.o. female who presents today for annual exam. She denies any changes to past medical history. There have been no recent hospitalizations. They are following a well balanced diet and exercise plan. Weight has been stable. No complaints today.   Father has DM and her mother has high blood pressure and hypothyroid. No colon cancer or breast cancer.   Hypothyroid: diagnosed in 2015. She is on of synthroid and this was reduced. Needs labs today.   RLS: she feels like she has to move her legs/feet at night time. She states it started in her last trimester of pregnancy. It keeps her up at night. It doesn't feel better if she moves her legs. achiness in her thighs and calves. She tries to drink 3L of water/day.   Immunization History  Administered Date(s) Administered  . Influenza,inj,Quad PF,6+ Mos 03/15/2019    Mammogram: routine age at 63 years.  Pap smear: 11/2017. Normal with dr. Cherly Hensen.  Tdap: 03/2018 at [facility].    Review of Systems  Constitutional: Negative for chills, fatigue and fever.  HENT: Negative for congestion, dental problem, ear pain, hearing loss, postnasal drip, rhinorrhea, sore throat and trouble swallowing.   Eyes: Negative for visual disturbance.  Respiratory: Negative for cough, chest tightness and shortness of breath.   Cardiovascular: Negative for chest pain, palpitations and leg swelling.  Gastrointestinal: Negative for abdominal pain, blood in stool, diarrhea, nausea and vomiting.  Endocrine: Negative for cold intolerance, polydipsia, polyphagia and polyuria.  Genitourinary: Negative for dysuria, frequency, hematuria and urgency.  Musculoskeletal: Negative for arthralgias, back pain, myalgias and neck pain.  Skin: Negative for rash.  Neurological:  Negative for dizziness and headaches.  Psychiatric/Behavioral: Positive for sleep disturbance. Negative for dysphoric mood. The patient is not nervous/anxious.     Allergies Patient has No Known Allergies.  Past Medical History Patient  has a past medical history of Complication of anesthesia and Hypothyroidism.  Surgical History Patient  has a past surgical history that includes No past surgeries; Dilation and curettage of uterus; and Cesarean section (N/A, 06/06/2018).  Family History Pateint's family history includes Diabetes in her father; Hypertension in her mother; Hypothyroidism in her mother.  Social History Patient  reports that she has never smoked. She has never used smokeless tobacco. She reports previous alcohol use. She reports that she does not use drugs.    Objective: Vitals:   03/15/19 1308  BP: 112/74  Pulse: 69  Temp: 98.2 F (36.8 C)  TempSrc: Skin  SpO2: 98%  Weight: 147 lb 12.8 oz (67 kg)  Height: 5\' 3"  (1.6 m)    Body mass index is 26.18 kg/m.  Physical Exam Vitals signs reviewed.  Constitutional:      Appearance: Normal appearance. She is well-developed.  HENT:     Head: Normocephalic and atraumatic.     Right Ear: Tympanic membrane, ear canal and external ear normal.     Left Ear: Tympanic membrane, ear canal and external ear normal.     Nose: Nose normal.     Mouth/Throat:     Mouth: Mucous membranes are moist.  Eyes:     Extraocular Movements: Extraocular movements intact.     Conjunctiva/sclera: Conjunctivae normal.     Pupils: Pupils are equal, round, and reactive to light.  Neck:     Musculoskeletal: Normal range of motion and neck supple.     Thyroid: No thyromegaly.     Comments: No thyromegaly  Cardiovascular:     Rate and Rhythm: Normal rate and regular rhythm.     Pulses: Normal pulses.     Heart sounds: Normal heart sounds. No murmur.  Pulmonary:     Effort: Pulmonary effort is normal.     Breath sounds: Normal breath  sounds.  Abdominal:     General: Abdomen is flat. Bowel sounds are normal. There is no distension.     Palpations: Abdomen is soft.     Tenderness: There is no abdominal tenderness.  Lymphadenopathy:     Cervical: No cervical adenopathy.  Skin:    General: Skin is warm and dry.     Capillary Refill: Capillary refill takes less than 2 seconds.     Findings: No rash.  Neurological:     General: No focal deficit present.     Mental Status: She is alert and oriented to person, place, and time.     Cranial Nerves: No cranial nerve deficit.     Coordination: Coordination normal.     Deep Tendon Reflexes: Reflexes normal.  Psychiatric:        Mood and Affect: Mood normal.        Behavior: Behavior normal.       Depression screen [facility] 2/9 03/15/2019  Decreased Interest 0  Down, Depressed, Hopeless 0  PHQ - 2 Score 0    Assessment/plan: 1. Acquired hypothyroidism  - T4, free - TSH  2. Need for immunization against influenza  - Flu Vaccine QUAD 36+ mos IM  3. Annual physical exam Routine lab work today. Flu shot today. Requesting records for tdap/pap smear as well. Really encouraged exercise with her as she is not doing this. Will wait on dentist, nervous with covid. F/u in one year or as needed.  Patient counseling [x]    Nutrition: Stressed importance of moderation in sodium/caffeine intake, saturated fat and cholesterol, caloric balance, sufficient intake of fresh fruits, vegetables, fiber, calcium, iron, and 1 mg of folate supplement per day (for females capable of pregnancy).  [x]    Stressed the importance of regular exercise.   []    Substance Abuse: Discussed cessation/primary prevention of tobacco, alcohol, or other drug use; driving or other dangerous activities under the influence; availability of treatment for abuse.   [x]    Injury prevention: Discussed safety belts, safety helmets, smoke detector, smoking near bedding or upholstery.   [x]    Sexuality: Discussed sexually  transmitted diseases, partner selection, use of condoms, avoidance of unintended pregnancy  and contraceptive alternatives.  [x]    Dental health: Discussed importance of regular tooth brushing, flossing, and dental visits.  [x]    Health maintenance and immunizations reviewed. Please refer to Health maintenance section.    - Comprehensive metabolic panel - CBC with Differential/Platelet - Lipid panel - VITAMIN D 25 Hydroxy (Vit-D Deficiency, Fractures)  5. RLS -checking ferritin/cbc. If iron deficient will replace. If normal will have her start ropinorole and discussed dosing with her. Also wrote down taper. F/u in one month.    Return in about 1 month (around 04/14/2019) for vitural visit on thursday for RLS .   Orma Flaming, MD [place]  03/15/2019

## 2019-03-17 ENCOUNTER — Other Ambulatory Visit: Payer: Self-pay | Admitting: Family Medicine

## 2019-03-17 DIAGNOSIS — E559 Vitamin D deficiency, unspecified: Secondary | ICD-10-CM

## 2019-03-17 DIAGNOSIS — G2581 Restless legs syndrome: Secondary | ICD-10-CM

## 2019-03-17 MED ORDER — LEVOTHYROXINE SODIUM 112 MCG PO TABS: 112.0000 ug | ORAL_TABLET | Freq: Every day | ORAL | 3 refills | Status: DC

## 2019-03-17 MED ORDER — VITAMIN D (ERGOCALCIFEROL) 1.25 MG (50000 UNIT) PO CAPS: ORAL_CAPSULE | ORAL | 0 refills | Status: AC

## 2019-04-15 ENCOUNTER — Ambulatory Visit: Payer: Managed Care, Other (non HMO) | Admitting: Family Medicine

## 2019-05-14 ENCOUNTER — Other Ambulatory Visit: Payer: Self-pay | Admitting: Family Medicine

## 2019-08-30 ENCOUNTER — Ambulatory Visit: Payer: Self-pay | Attending: Internal Medicine

## 2020-04-27 ENCOUNTER — Other Ambulatory Visit: Payer: Self-pay | Admitting: Family Medicine

## 2020-08-07 ENCOUNTER — Other Ambulatory Visit: Payer: Self-pay | Admitting: Family Medicine
